# Patient Record
Sex: Female | Born: 1980 | Hispanic: Yes | Marital: Single | Smoking: Never smoker
Health system: Southern US, Community
[De-identification: ages and names within clinical notes are randomized; demographics above are authoritative.]

## PROBLEM LIST (undated history)

## (undated) DIAGNOSIS — I10 Essential (primary) hypertension: Secondary | ICD-10-CM

## (undated) HISTORY — PX: NO PAST SURGERIES: SHX2092

---

## 2013-08-20 ENCOUNTER — Emergency Department (HOSPITAL_COMMUNITY)
Admission: EM | Admit: 2013-08-20 | Discharge: 2013-08-20 | Disposition: A | Discharge status: Home / Self Care | Payer: Self-pay | Attending: Emergency Medicine | Admitting: Emergency Medicine

## 2013-08-20 ENCOUNTER — Emergency Department (HOSPITAL_COMMUNITY): Payer: Self-pay

## 2013-08-20 ENCOUNTER — Encounter (HOSPITAL_COMMUNITY): Payer: Self-pay | Admitting: Emergency Medicine

## 2013-08-20 DIAGNOSIS — O200 Threatened abortion: Secondary | ICD-10-CM | POA: Insufficient documentation

## 2013-08-20 DIAGNOSIS — O169 Unspecified maternal hypertension, unspecified trimester: Secondary | ICD-10-CM | POA: Insufficient documentation

## 2013-08-20 DIAGNOSIS — M545 Low back pain, unspecified: Secondary | ICD-10-CM | POA: Insufficient documentation

## 2013-08-20 DIAGNOSIS — O9989 Other specified diseases and conditions complicating pregnancy, childbirth and the puerperium: Secondary | ICD-10-CM

## 2013-08-20 DIAGNOSIS — M259 Joint disorder, unspecified: Secondary | ICD-10-CM | POA: Insufficient documentation

## 2013-08-20 DIAGNOSIS — M899 Disorder of bone, unspecified: Secondary | ICD-10-CM | POA: Insufficient documentation

## 2013-08-20 HISTORY — DX: Essential (primary) hypertension: I10

## 2013-08-20 LAB — CBC
HCT: 38.6 % (ref 36.0–46.0)
Hemoglobin: 12.4 g/dL (ref 12.0–15.0)
MCH: 25.1 pg — ABNORMAL LOW (ref 26.0–34.0)
MCHC: 32.1 g/dL (ref 30.0–36.0)
MCV: 78 fL (ref 78.0–100.0)
PLATELETS: 302 10*3/uL (ref 150–400)
RBC: 4.95 MIL/uL (ref 3.87–5.11)
RDW: 16.4 % — AB (ref 11.5–15.5)
WBC: 10.7 10*3/uL — AB (ref 4.0–10.5)

## 2013-08-20 LAB — HCG, QUANTITATIVE, PREGNANCY: hCG, Beta Chain, Quant, S: 14562 m[IU]/mL — ABNORMAL HIGH (ref <5)

## 2013-08-20 LAB — BASIC METABOLIC PANEL
BUN: 8 mg/dL (ref 6–23)
CHLORIDE: 100 meq/L (ref 96–112)
CO2: 20 mEq/L (ref 19–32)
Calcium: 9.1 mg/dL (ref 8.4–10.5)
Creatinine, Ser: 0.51 mg/dL (ref 0.50–1.10)
GFR calc non Af Amer: >90 mL/min (ref >90)
Glucose, Bld: 108 mg/dL — ABNORMAL HIGH (ref 70–99)
POTASSIUM: 3.7 meq/L (ref 3.7–5.3)
SODIUM: 134 meq/L — AB (ref 137–147)

## 2013-08-20 LAB — POC URINE PREG, ED: PREG TEST UR: POSITIVE — AB

## 2013-08-20 LAB — ABO/RH: ABO/RH(D): O POS

## 2013-08-20 NOTE — Discharge Instructions (Signed)
Aborto espontáneo  °(Miscarriage) °El aborto espontáneo es la pérdida de un bebé que no ha nacido (feto) antes de la semana 20 del embarazo. La mayor parte de estos abortos ocurre en los primeros 3 meses. En algunos casos ocurre antes de que la mujer sepa que está embarazada. También se denomina "aborto espontáneo" o "pérdida prematura del embarazo". El aborto espontáneo puede ser una experiencia que afecte emocionalmente a la persona. Converse con su médico si tiene dudas, cómo es el proceso de duelo, y sobre planes futuros de embarazo.  °CAUSAS  °· Algunos problemas cromosómicos pueden hacer imposible que el bebé se desarrolle normalmente. Los problemas con los genes o cromosomas del bebé son generalmente el resultado de errores que se producen, por casualidad, cuando el embrión se divide y crece. Estos problemas no se heredan de los padres. °· Infección en el cuello del útero.   °· Problemas hormonales.   °· Problemas en el cuello del útero, como tener un útero incompetente. Esto ocurre cuando los tejidos no son lo suficientemente fuertes como para contener el embarazo.   °· Problemas del útero, como un útero con forma anormal, los fibromas o anormalidades congénitas.   °· Ciertas enfermedades crónicas.   °· No fume, no beba alcohol, ni consuma drogas.   °· Traumatismos   °A veces, la causa es desconocida.  °SÍNTOMAS  °· Sangrado o manchado vaginal, con o sin cólicos o dolor. °· Dolor o cólicos en el abdomen o en la cintura. °· Eliminación de líquido, tejidos o coágulos grandes por la vagina. °DIAGNÓSTICO  °El médico le hará un examen físico. También le indicará una ecografía para confirmar el aborto. Es posible que se realicen análisis de sangre.  °TRATAMIENTO  °· En algunos casos el tratamiento no es necesario, si se eliminan naturalmente todos los tejidos embrionarios que se encontraban en el útero. Si el feto o la placenta quedan dentro del útero (aborto incompleto), pueden infectarse, los tejidos que quedan  pueden infectarse y deben retirarse. Generalmente se realiza un procedimiento de dilatación y curetaje (D y C). Durante el procedimiento de dilatación y curetaje, el cuello del útero se abre (dilata) y se retira cualquier resto de tejido fetal o placentario del útero. °· Si hay una infección, le recetarán antibióticos. Podrán recetarle otros medicamentos para reducir el tamaño del útero (contraerlo) si hay una mucho sangrado. °· Si su sangre es Rh negativa y su bebé es Rh positivo, usted necesitará la inyección de inmunoglobulina Rh. Esta inyección protegerá a los futuros bebés de tener problemas de compatibilidad Rh en futuros embarazos. °INSTRUCCIONES PARA EL CUIDADO EN EL HOGAR  °· El médico le indicará reposo en cama o le permitirá realizar actividades livianas. Vuelva a la actividad lentamente o según las indicaciones de su médico. °· Pídale a alguien que la ayude con las responsabilidades familiares y del hogar durante este tiempo.   °· Lleve un registro de la cantidad y la saturación de las toallas higiénicas que utiliza cada día. Anote esta información   °· No use tampones. No No se haga duchas vaginales ni tenga relaciones sexuales hasta que el médico la autorice.   °· Sólo tome medicamentos de venta libre o recetados para calmar el dolor o el malestar, según las indicaciones de su médico.   °· No tome aspirina. La aspirina puede ocasionar hemorragias.   °· Concurra puntualmente a las citas de control con el médico.   °· Si usted o su pareja tienen dificultades con el duelo, hable con su médico para buscar la ayuda psicológica que los ayude a enfrentar la pérdida   del embarazo. Permítase el tiempo suficiente de duelo antes de quedar embarazada nuevamente.   °SOLICITE ATENCIÓN MÉDICA DE INMEDIATO SI:  °· Siente calambres intensos o dolor en la espalda o en el abdomen. °· Tiene fiebre. °· Elimina grandes coágulos de sangre (del tamaño de una nuez o más) o tejidos por la vagina. Guarde lo que ha eliminado para  que su médico lo examine.   °· La hemorragia aumenta.   °· Observa una secreción vaginal espesa y con mal olor. °· Se siente mareada, débil, o se desmaya.   °· Siente escalofríos.   °ASEGÚRESE DE QUE:  °· Comprende estas instrucciones. °· Controlará su enfermedad. °· Solicitará ayuda de inmediato si no mejora o si empeora. °Document Released: 12/15/2004 Document Revised: 07/02/2012 °ExitCare® Patient Information ©2014 [employer]. ° °

## 2013-08-20 NOTE — ED Notes (Signed)
Per pt report: pt is 2.5 months pregnant and began having vaginal bleeding yesterday.  Pt reports changing her pad 3x in the past hour.  Pt a/o x 4.  Skin warm and dry. Pt ambulatory.

## 2013-08-20 NOTE — ED Provider Notes (Signed)
CSN: [id number]     Arrival date & time 08/20/13  0317 History   First MD Initiated Contact with Patient 08/20/13 [phone]     Chief Complaint  Patient presents with  . Vaginal Bleeding     HPI Based on her last normal menstrual period the patient believes she is approximately [redacted] weeks pregnant.  She's had a positive pregnancy test at home.  She has not had a first trimester ultrasound.  She has not seen a obstetric to this point.  Patient is a G2 P1 A0.  She states she develop vaginal bleeding yesterday as well as some crampy low back pain.  Her vaginal bleeding continued today.  Her bleeding worsened this evening which brought her to the emergency department.  She's not remember passing any tissue.  Denies abdominal pain.  Denies lightheadedness or weakness.  No prior history of 60 transmitted disease.  No prior history of ectopic pregnancy.   Past Medical History  Diagnosis Date  . Hypertension    History reviewed. No pertinent past surgical history. No family history on file. History  Substance Use Topics  . Smoking status: Never Smoker   . Smokeless tobacco: Not on file  . Alcohol Use: No   OB History   Grav Para Term Preterm Abortions TAB SAB Ect Mult Living   1              Review of Systems  All other systems reviewed and are negative.     Allergies  Review of patient's allergies indicates no known allergies.  Home Medications   Prior to Admission medications   Medication Sig Start Date End Date Taking? Authorizing Provider  Prenatal Vit-Fe Fumarate-FA (PRENATAL MULTIVITAMIN) TABS tablet Take 1 tablet by mouth daily at 12 noon.   Yes Historical Provider, MD   BP 123/82  Pulse 94  Temp(Src) 98 F (36.7 C) (Oral)  Resp 18  SpO2 100% Physical Exam  Nursing note and vitals reviewed. Constitutional: She is oriented to person, place, and time. She appears well-developed and well-nourished. No distress.  HENT:  Head: Normocephalic and atraumatic.  Eyes: EOM are  normal.  Neck: Normal range of motion.  Cardiovascular: Normal rate, regular rhythm and normal heart sounds.   Pulmonary/Chest: Effort normal and breath sounds normal.  Abdominal: Soft. She exhibits no distension. There is no tenderness.  Genitourinary:  Normal external genitalia.  Cervical os open.  No obvious fetal tissue noted.  No active vaginal bleeding.  Blood noted in the vaginal vault with some clotting  Musculoskeletal: Normal range of motion.  Neurological: She is alert and oriented to person, place, and time.  Skin: Skin is warm and dry.  Psychiatric: She has a normal mood and affect. Judgment normal.    ED Course  Procedures (including critical care time) Labs Review Labs Reviewed  CBC - Abnormal; Notable for the following:    WBC 10.7 (*)    MCH 25.1 (*)    RDW 16.4 (*)    All other components within normal limits  BASIC METABOLIC PANEL - Abnormal; Notable for the following:    Sodium 134 (*)    Glucose, Bld 108 (*)    All other components within normal limits  HCG, QUANTITATIVE, PREGNANCY - Abnormal; Notable for the following:    hCG, Beta Chain, Quant, S 14562 (*)    All other components within normal limits  POC URINE PREG, ED - Abnormal; Notable for the following:    Preg Test, Ur POSITIVE (*)  All other components within normal limits  ABO/RH    Imaging Review US Ob Comp Less 14 Wks  08/20/2013   CLINICAL DATA:  Pelvic pain in pregnancy.  EXAM: OBSTETRIC <14 WK [place] AND TRANSVAGINAL OB US  TECHNIQUE: Both transabdominal and transvaginal ultrasound examinations were performed for complete evaluation of the gestation as well as the maternal uterus, adnexal regions, and pelvic cul-de-sac. Transvaginal technique was performed to assess early pregnancy.  COMPARISON:  None.  FINDINGS: Intrauterine gestational sac: Visualized/normal in shape.  Yolk sac:  Not seen  Embryo:  Not seen  MSD:  18  mm  Maternal uterus/adnexae: The ovaries are symmetric in size and normal in  appearance. Unremarkable uterus. No free pelvic fluid. No subchorionic hematoma.  IMPRESSION: Intrauterine gestation, mean sac diameter 18 mm. No fetal contents yet visible; findings are suspicious but not yet definitive for failed pregnancy. Recommend follow-up US in 10-14 days for definitive diagnosis. This recommendation follows SRU consensus guidelines: Diagnostic Criteria for Nonviable Pregnancy Early in the First Trimester. Malva Limes Med 2013; 165:7903-83.   Electronically Signed   By: Tiburcio Pea M.D.   On: 08/20/2013 05:47   US Ob Transvaginal  08/20/2013   CLINICAL DATA:  Pelvic pain in pregnancy.  EXAM: OBSTETRIC <14 WK [place] AND TRANSVAGINAL OB US  TECHNIQUE: Both transabdominal and transvaginal ultrasound examinations were performed for complete evaluation of the gestation as well as the maternal uterus, adnexal regions, and pelvic cul-de-sac. Transvaginal technique was performed to assess early pregnancy.  COMPARISON:  None.  FINDINGS: Intrauterine gestational sac: Visualized/normal in shape.  Yolk sac:  Not seen  Embryo:  Not seen  MSD:  18  mm  Maternal uterus/adnexae: The ovaries are symmetric in size and normal in appearance. Unremarkable uterus. No free pelvic fluid. No subchorionic hematoma.  IMPRESSION: Intrauterine gestation, mean sac diameter 18 mm. No fetal contents yet visible; findings are suspicious but not yet definitive for failed pregnancy. Recommend follow-up US in 10-14 days for definitive diagnosis. This recommendation follows SRU consensus guidelines: Diagnostic Criteria for Nonviable Pregnancy Early in the First Trimester. Malva Limes Med 2013; 338:3291-91.   Electronically Signed   By: Tiburcio Pea M.D.   On: 08/20/2013 05:47  I personally reviewed the imaging tests through PACS system I reviewed available ER/hospitalization records through the EMR    EKG Interpretation None      MDM   Final diagnoses:  Threatened miscarriage in early pregnancy    I spoke with the  patient at length with the interpreter phone.  I suspect this is a complete miscarriage to this point.  I do not visualize any fetal tissue.  Patient will followup at [facility] hospital in 48 hours for a repeat beta-hCG.  Patient understands to return either to this emergency Department or to the [facility] emergency department for any new or worsening vaginal bleeding.  No free fluid.  No adnexal masses noted by radiology.  Suspect complete miscarriage.     Lyanne Co, MD 08/20/13 [phone]

## 2013-08-20 NOTE — ED Notes (Signed)
Dr. Campos at bedside   

## 2013-08-20 NOTE — ED Notes (Signed)
US at bedside

## 2013-08-20 NOTE — ED Notes (Signed)
Pt made aware of need for urine but reports she does not have to go right now.  Pt was also made aware that the MD wants the urine within 30 minutes and staff will in and out cath her to obtain urine.

## 2013-08-26 ENCOUNTER — Encounter (HOSPITAL_COMMUNITY): Payer: Self-pay

## 2013-08-26 ENCOUNTER — Inpatient Hospital Stay (HOSPITAL_COMMUNITY)
Admission: AD | Admit: 2013-08-26 | Discharge: 2013-08-26 | Disposition: A | Discharge status: Home / Self Care | Payer: Self-pay | Source: Ambulatory Visit | Attending: Obstetrics and Gynecology | Admitting: Obstetrics and Gynecology

## 2013-08-26 DIAGNOSIS — O039 Complete or unspecified spontaneous abortion without complication: Secondary | ICD-10-CM | POA: Insufficient documentation

## 2013-08-26 LAB — URINALYSIS, ROUTINE W REFLEX MICROSCOPIC
BILIRUBIN URINE: NEGATIVE
Glucose, UA: NEGATIVE mg/dL
Ketones, ur: NEGATIVE mg/dL
Nitrite: NEGATIVE
Protein, ur: NEGATIVE mg/dL
UROBILINOGEN UA: 0.2 mg/dL (ref 0.0–1.0)
pH: 5.5 (ref 5.0–8.0)

## 2013-08-26 LAB — URINE MICROSCOPIC-ADD ON

## 2013-08-26 LAB — HCG, QUANTITATIVE, PREGNANCY: hCG, Beta Chain, Quant, S: 324 m[IU]/mL — ABNORMAL HIGH (ref <5)

## 2013-08-26 NOTE — MAU Provider Note (Signed)
Attestation of Attending Supervision of Advanced Practitioner (CNM/NP): Evaluation and management procedures were performed by the Advanced Practitioner under my supervision and collaboration.  I have reviewed the Advanced Practitioner's note and chart, and I agree with the management and plan.  Cranston Koors 08/26/2013 2:05 PM   

## 2013-08-26 NOTE — Discharge Instructions (Signed)
Aborto incompleto °(Incomplete Miscarriage) °Un aborto espontáneo es la pérdida repentina de un bebé en gestación (feto) antes de la semana 20 del embarazo. En un aborto espontáneo, partes del feto o la placenta (alumbramiento) permanecen en el cuerpo.  °El aborto espontáneo puede ser una experiencia que afecte emocionalmente a la persona. Hable con su médico si tiene preguntas sobre el aborto espontáneo, el proceso de duelo y los planes futuros de embarazo. °CAUSAS  °· Algunos problemas cromosómicos pueden hacer imposible que el bebé se desarrolle normalmente. Los problemas con los genes o cromosomas del bebé son, en la mayoría de los casos, el resultado de errores que se producen, al azar, cuando el embrión se divide y crece. Estos problemas no se heredan de los padres. °· Infección en el cuello del útero. °· Problemas hormonales. °· Problemas en el cuello del útero, como tener un útero incompetente. Esto ocurre cuando los tejidos no son lo suficientemente fuertes como para contener el embarazo. °· Problemas del útero, como un útero con forma anormal, los fibromas o anormalidades congénitas. °· Ciertas enfermedades crónicas. °· No fume, no beba alcohol, ni consuma drogas. °· Traumatismos. °SÍNTOMAS  °· Sangrado o manchado vaginal, con o sin cólicos o dolor. °· Dolor o cólicos en el abdomen o en la cintura. °· Eliminación de líquido, tejidos o coágulos grandes por la vagina. °DIAGNÓSTICO  °El médico le hará un examen físico. También le indicará una ecografía para confirmar el aborto. Es posible que se realicen análisis de sangre. °TRATAMIENTO  °· Generalmente se realiza un procedimiento de dilatación y curetaje (D y C). Durante el procedimiento de dilatación y curetaje, el cuello del útero se abre (dilata) y se retira todo resto de tejido fetal o placentario del útero. °· Si hay una infección, le recetarán antibióticos. Posiblemente le receten otros medicamentos para reducir (contraer) el tamaño del útero si hay  mucha hemorragia. °· Si su tipo de sangre es Rh negativo y el del bebé es Rh positivo, necesitará una inyección de inmunoglobulina Rho(D). Esta inyección protegerá a los futuros bebés de tener problemas de compatibilidad Rh en futuros embarazos. °· Probablemente le indiquen reposo. Esto significa que debe quedarse en cama y levantarse únicamente para ir al baño. °INSTRUCCIONES PARA EL CUIDADO EN EL HOGAR  °· Haga reposo según las indicaciones del médico. °· Limite las actividades según las indicaciones del médico. Es posible que se le permita retomar las actividades livianas si no se le realizó un curetaje, pero necesitará tratamiento adicional. °· Lleve un registro de la cantidad de toallas sanitarias que usa por día. Observe cuán impregnadas (saturadas) están. Registre esta información. °· No  use tampones. °· No se haga duchas vaginales ni tenga relaciones sexuales hasta que el médico la autorice. °· Asista a todas las citas de seguimiento para una nueva evaluación y para continuar el tratamiento. °· Sólo tome medicamentos de venta libre o recetados para calmar el dolor, el malestar o bajar la fiebre, según las indicaciones de su médico. °· Tome los antibióticos como le indicó el médico. Asegúrese de que finaliza la prescripción completa aunque se sienta mejor. °SOLICITE ATENCIÓN MÉDICA DE INMEDIATO SI:  °· Siente calambres intensos en el estómago, en la espalda o en el abdomen. °· Le sube la fiebre sin motivo (asegúrese de registrar las cifras). °· Elimina coágulos grandes o tejidos (consérvelos para que el médico los analice). °· La hemorragia aumenta. °· Se siente mareada, débil o tiene episodios de desmayo. °ASEGÚRESE DE QUE:  °· Comprende estas instrucciones. °·   Controlará su afección. °· Recibirá ayuda de inmediato si no mejora o si empeora. °Document Released: 03/07/2005 Document Revised: 12/26/2012 °ExitCare® Patient Information ©2014 [employer]. ° °

## 2013-08-26 NOTE — MAU Note (Signed)
Urine in lab 

## 2013-08-26 NOTE — MAU Note (Signed)
Patient states she was seen at [facility] on 6-2 and was instructed to return to MAU yesterday. Has lower abdominal pain that comes and goes. States light bleeding. No blood on pad when patient arrived in MAU.

## 2013-08-26 NOTE — MAU Provider Note (Signed)
Ms. Olivianna Halas is a 33 y.o. G1P0 at [redacted]w[redacted]d who presents to MAU today for follow-up. The patient was seen at [facility] on 08/20/13 and US showed IUGS only. Patient has continued to have bleeding similar to a period that has been lighter today. She rates her abdominal pain at 6/10 now and states that it is relieved by Ibuprofen.   BP 134/92  Pulse 73  Temp(Src) 98.4 F (36.9 C) (Oral)  Resp 16  Ht 5' 2.5" (1.588 m)  Wt 184 lb 12.8 oz (83.825 kg)  BMI 33.24 kg/m2  LMP 06/13/2013 GENERAL: Well-developed, well-nourished female in no acute distress.  HEENT: Normocephalic, atraumatic.   LUNGS: Effort normal HEART: Regular rate  SKIN: Warm, dry and without erythema PSYCH: Normal mood and affect  Results for JALEEA, MALACHOWSKI (MRN [id number]) as of 08/26/2013 10:19  Ref. Range 08/20/2013 03:49 08/20/2013 04:25 08/20/2013 05:25 08/26/2013 08:20 08/26/2013 09:16  hCG, Beta Chain, Quant, S Latest Range: <5 mIU/mL 14562 (H)    324 (H)    MDM [facility], interpreter was present for patient encounter  A: SAB  P: Discharge home Bleeding precautions discussed Patient referred to [facility] for follow-up in 2 weeks. They will call the patient with an appointment Patient advised to use condoms if she decides to be sexually active prior to her clinic appointment Patient may return to MAU as needed or if her condition were to change or worsen  Freddi Starr, PA-C 08/26/2013 10:20 AM

## 2013-09-09 ENCOUNTER — Ambulatory Visit (INDEPENDENT_AMBULATORY_CARE_PROVIDER_SITE_OTHER): Payer: Self-pay | Admitting: Family Medicine

## 2013-09-09 ENCOUNTER — Encounter: Payer: Self-pay | Admitting: Family Medicine

## 2013-09-09 VITALS — BP 118/86 | HR 79 | Ht 62.0 in | Wt 185.3 lb

## 2013-09-09 DIAGNOSIS — O039 Complete or unspecified spontaneous abortion without complication: Secondary | ICD-10-CM

## 2013-09-09 NOTE — Patient Instructions (Signed)
Aborto espontáneo  °(Miscarriage) °El aborto espontáneo es la pérdida de un bebé que no ha nacido (feto) antes de la semana 20 del embarazo. La mayor parte de estos abortos ocurre en los primeros 3 meses. En algunos casos ocurre antes de que la mujer sepa que está embarazada. También se denomina "aborto espontáneo" o "pérdida prematura del embarazo". El aborto espontáneo puede ser una experiencia que afecte emocionalmente a la persona. Converse con su médico si tiene dudas, cómo es el proceso de duelo, y sobre planes futuros de embarazo.  °CAUSAS  °· Algunos problemas cromosómicos pueden hacer imposible que el bebé se desarrolle normalmente. Los problemas con los genes o cromosomas del bebé son generalmente el resultado de errores que se producen, por casualidad, cuando el embrión se divide y crece. Estos problemas no se heredan de los padres. °· Infección en el cuello del útero.   °· Problemas hormonales.   °· Problemas en el cuello del útero, como tener un útero incompetente. Esto ocurre cuando los tejidos no son lo suficientemente fuertes como para contener el embarazo.   °· Problemas del útero, como un útero con forma anormal, los fibromas o anormalidades congénitas.   °· Ciertas enfermedades crónicas.   °· No fume, no beba alcohol, ni consuma drogas.   °· Traumatismos   °A veces, la causa es desconocida.  °SÍNTOMAS  °· Sangrado o manchado vaginal, con o sin cólicos o dolor. °· Dolor o cólicos en el abdomen o en la cintura. °· Eliminación de líquido, tejidos o coágulos grandes por la vagina. °DIAGNÓSTICO  °El médico le hará un examen físico. También le indicará una ecografía para confirmar el aborto. Es posible que se realicen análisis de sangre.  °TRATAMIENTO  °· En algunos casos el tratamiento no es necesario, si se eliminan naturalmente todos los tejidos embrionarios que se encontraban en el útero. Si el feto o la placenta quedan dentro del útero (aborto incompleto), pueden infectarse, los tejidos que quedan  pueden infectarse y deben retirarse. Generalmente se realiza un procedimiento de dilatación y curetaje (D y C). Durante el procedimiento de dilatación y curetaje, el cuello del útero se abre (dilata) y se retira cualquier resto de tejido fetal o placentario del útero. °· Si hay una infección, le recetarán antibióticos. Podrán recetarle otros medicamentos para reducir el tamaño del útero (contraerlo) si hay una mucho sangrado. °· Si su sangre es Rh negativa y su bebé es Rh positivo, usted necesitará la inyección de inmunoglobulina Rh. Esta inyección protegerá a los futuros bebés de tener problemas de compatibilidad Rh en futuros embarazos. °INSTRUCCIONES PARA EL CUIDADO EN EL HOGAR  °· El médico le indicará reposo en cama o le permitirá realizar actividades livianas. Vuelva a la actividad lentamente o según las indicaciones de su médico. °· Pídale a alguien que la ayude con las responsabilidades familiares y del hogar durante este tiempo.   °· Lleve un registro de la cantidad y la saturación de las toallas higiénicas que utiliza cada día. Anote esta información   °· No use tampones. No No se haga duchas vaginales ni tenga relaciones sexuales hasta que el médico la autorice.   °· Sólo tome medicamentos de venta libre o recetados para calmar el dolor o el malestar, según las indicaciones de su médico.   °· No tome aspirina. La aspirina puede ocasionar hemorragias.   °· Concurra puntualmente a las citas de control con el médico.   °· Si usted o su pareja tienen dificultades con el duelo, hable con su médico para buscar la ayuda psicológica que los ayude a enfrentar la pérdida   del embarazo. Permítase el tiempo suficiente de duelo antes de quedar embarazada nuevamente.   °SOLICITE ATENCIÓN MÉDICA DE INMEDIATO SI:  °· Siente calambres intensos o dolor en la espalda o en el abdomen. °· Tiene fiebre. °· Elimina grandes coágulos de sangre (del tamaño de una nuez o más) o tejidos por la vagina. Guarde lo que ha eliminado para  que su médico lo examine.   °· La hemorragia aumenta.   °· Observa una secreción vaginal espesa y con mal olor. °· Se siente mareada, débil, o se desmaya.   °· Siente escalofríos.   °ASEGÚRESE DE QUE:  °· Comprende estas instrucciones. °· Controlará su enfermedad. °· Solicitará ayuda de inmediato si no mejora o si empeora. °Document Released: 12/15/2004 Document Revised: 07/02/2012 °ExitCare® Patient Information ©2015 [employer]. This information is not intended to replace advice given to you by your health care provider. Make sure you discuss any questions you have with your health care provider. ° °

## 2013-09-09 NOTE — Progress Notes (Signed)
S:  33 yo G2P1011 who presents today for f/u after SAB.   Seen on 6/2 in ED and had an US showing IUGS only Continued bleeding throughout the first part of JUne Seen in MAU on 6/8 and quant had dropped from 14562 to 324  Since then, bleeding has improved and is now gone No pain.   No fevers, chills, nausea, vomiting, diarrhea, constipation.     Past Medical History  Diagnosis Date  . Hypertension    History reviewed. No pertinent family history. History   Social History  . Marital Status: Single    Spouse Name: N/A    Number of Children: N/A  . Years of Education: N/A   Occupational History  . Not on file.   Social History Main Topics  . Smoking status: Never Smoker   . Smokeless tobacco: Not on file  . Alcohol Use: No  . Drug Use: No  . Sexual Activity: Not on file   Other Topics Concern  . Not on file   Social History Narrative  . No narrative on file    O: Filed Vitals:   09/09/13 1426  BP: 118/86  Pulse: 79  Height: 5\' 2"  (1.575 m)  Weight: 185 lb 4.8 oz (84.052 kg)   Gen: NAD, well appearing ABD: soft, ND, NT  A/P  - repeat quant today to ensure has gone down to normal  - asymptomatic  - advised to try and wait 3 months to start trying to conceive again.    BECK, Redmond BasemanKELI L, MD

## 2013-09-10 ENCOUNTER — Telehealth: Payer: Self-pay | Admitting: General Practice

## 2013-09-10 LAB — HCG, QUANTITATIVE, PREGNANCY: hCG, Beta Chain, Quant, S: 4.5 m[IU]/mL

## 2013-09-10 NOTE — Telephone Encounter (Signed)
Message copied by Kathee DeltonHILLMAN, CARRIE L on Tue Sep 10, 2013 11:31 AM ------      Message from: Vale HavenBECK, KELI L      Created: Tue Sep 10, 2013  9:37 AM       Can we let her know her Sharene Buttersquant was good? ------

## 2013-09-10 NOTE — Telephone Encounter (Signed)
Patient [facility] in to front [facility] and patient informed of results. Patient verbalized understanding and had no questions

## 2013-09-10 NOTE — Telephone Encounter (Signed)
Called patient with pacific interpreter [phone], no answer- left message that we are trying to reach you with results, nothing urgent but call us back at the clinics

## 2014-01-20 ENCOUNTER — Encounter: Payer: Self-pay | Admitting: Family Medicine

## 2015-03-26 ENCOUNTER — Ambulatory Visit (INDEPENDENT_AMBULATORY_CARE_PROVIDER_SITE_OTHER): Payer: Self-pay | Admitting: Obstetrics and Gynecology

## 2015-03-26 ENCOUNTER — Encounter: Payer: Self-pay | Admitting: Obstetrics and Gynecology

## 2015-03-26 VITALS — BP 125/84 | HR 93 | Wt 199.0 lb

## 2015-03-26 DIAGNOSIS — O99210 Obesity complicating pregnancy, unspecified trimester: Secondary | ICD-10-CM | POA: Insufficient documentation

## 2015-03-26 DIAGNOSIS — Z8759 Personal history of other complications of pregnancy, childbirth and the puerperium: Secondary | ICD-10-CM | POA: Insufficient documentation

## 2015-03-26 DIAGNOSIS — F329 Major depressive disorder, single episode, unspecified: Secondary | ICD-10-CM

## 2015-03-26 DIAGNOSIS — O09521 Supervision of elderly multigravida, first trimester: Secondary | ICD-10-CM

## 2015-03-26 DIAGNOSIS — O09529 Supervision of elderly multigravida, unspecified trimester: Secondary | ICD-10-CM | POA: Insufficient documentation

## 2015-03-26 DIAGNOSIS — F32A Depression, unspecified: Secondary | ICD-10-CM

## 2015-03-26 DIAGNOSIS — O99340 Other mental disorders complicating pregnancy, unspecified trimester: Secondary | ICD-10-CM

## 2015-03-26 DIAGNOSIS — Z124 Encounter for screening for malignant neoplasm of cervix: Secondary | ICD-10-CM

## 2015-03-26 DIAGNOSIS — O09899 Supervision of other high risk pregnancies, unspecified trimester: Secondary | ICD-10-CM | POA: Insufficient documentation

## 2015-03-26 DIAGNOSIS — Z1151 Encounter for screening for human papillomavirus (HPV): Secondary | ICD-10-CM

## 2015-03-26 DIAGNOSIS — O99211 Obesity complicating pregnancy, first trimester: Secondary | ICD-10-CM

## 2015-03-26 DIAGNOSIS — E669 Obesity, unspecified: Secondary | ICD-10-CM

## 2015-03-26 DIAGNOSIS — O09891 Supervision of other high risk pregnancies, first trimester: Secondary | ICD-10-CM

## 2015-03-26 DIAGNOSIS — Z113 Encounter for screening for infections with a predominantly sexual mode of transmission: Secondary | ICD-10-CM

## 2015-03-26 MED ORDER — ASPIRIN 81 MG PO TABS: 81.0000 mg | ORAL_TABLET | Freq: Every day | ORAL | Status: DC

## 2015-03-26 NOTE — Patient Instructions (Signed)
Primer trimestre de embarazo (First Trimester of Pregnancy) El primer trimestre de embarazo se extiende desde la semana1 hasta el final de la semana12 (mes1 al mes3). Una semana despus de que un espermatozoide fecunda un vulo, este se implantar en la pared uterina. Este embrin comenzar a desarrollarse hasta convertirse en un beb. Sus genes y los de su pareja forman el beb. Los genes del varn determinan si ser un nio o una nia. Entre la semana6 y la8, se forman los ojos y el rostro, y los latidos del corazn pueden verse en la ecografa. Al final de las 12semanas, todos los rganos del beb estn formados.  Ahora que est embarazada, querr hacer todo lo que est a su alcance para tener un beb sano. Dos de las cosas ms importantes son tener una buena atencin prenatal y seguir las indicaciones del mdico. La atencin prenatal incluye toda la asistencia mdica que usted recibe antes del nacimiento del beb. Esta ayudar a prevenir, detectar y tratar cualquier problema durante el embarazo y el parto. CAMBIOS EN EL ORGANISMO Su organismo atraviesa por muchos cambios durante el embarazo, y estos varan de una mujer a otra.   Al principio, puede aumentar o bajar algunos kilos.  Puede tener malestar estomacal (nuseas) y vomitar. Si no puede controlar los vmitos, llame al mdico.  Puede cansarse con facilidad.  Es posible que tenga dolores de cabeza que pueden aliviarse con los medicamentos que el mdico le permita tomar.  Puede orinar con mayor frecuencia. El dolor al orinar puede significar que usted tiene una infeccin de la vejiga.  Debido al embarazo, puede tener acidez estomacal.  Puede estar estreida, ya que ciertas hormonas enlentecen los movimientos de los msculos que empujan los desechos a travs de los intestinos.  Pueden aparecer hemorroides o abultarse e hincharse las venas (venas varicosas).  Las mamas pueden empezar a agrandarse y estar sensibles. Los pezones  pueden sobresalir ms, y el tejido que los rodea (areola) tornarse ms oscuro.  Las encas pueden sangrar y estar sensibles al cepillado y al hilo dental.  Pueden aparecer zonas oscuras o manchas (cloasma, mscara del embarazo) en el rostro que probablemente se atenuarn despus del nacimiento del beb.  Los perodos menstruales se interrumpirn.  Tal vez no tenga apetito.  Puede sentir un fuerte deseo de consumir ciertos alimentos.  Puede tener cambios a nivel emocional da a da, por ejemplo, por momentos puede estar emocionada por el embarazo y por otros preocuparse porque algo pueda salir mal con el embarazo o el beb.  Tendr sueos ms vvidos y extraos.  Tal vez haya cambios en el cabello que pueden incluir su engrosamiento, crecimiento rpido y cambios en la textura. A algunas mujeres tambin se les cae el cabello durante o despus del embarazo, o tienen el cabello seco o fino. Lo ms probable es que el cabello se le normalice despus del nacimiento del beb. QU DEBE ESPERAR EN LAS CONSULTAS PRENATALES Durante una visita prenatal de rutina:  La pesarn para asegurarse de que usted y el beb estn creciendo normalmente.  Le controlarn la presin arterial.  Le medirn el abdomen para controlar el desarrollo del beb.  Se escucharn los latidos cardacos a partir de la semana10 o la12 de embarazo, aproximadamente.  Se analizarn los resultados de los estudios solicitados en visitas anteriores. El mdico puede preguntarle:  Cmo se siente.  Si siente los movimientos del beb.  Si ha tenido sntomas anormales, como prdida de lquido, sangrado, dolores de cabeza intensos o   médico puede preguntarle:  · Cómo se siente.  · Si siente los movimientos del bebé.  · Si ha tenido síntomas anormales, como pérdida de líquido, sangrado, dolores de cabeza intensos o cólicos abdominales.  · Si está consumiendo algún producto que contenga tabaco, como cigarrillos, tabaco de mascar y cigarrillos electrónicos.  · Si tiene alguna pregunta.  Otros estudios que pueden realizarse durante el primer trimestre incluyen lo siguiente:  · Análisis de sangre para determinar el tipo  de sangre y detectar la presencia de infecciones previas. Además, se los usará para controlar si los niveles de hierro son bajos (anemia) y determinar los anticuerpos Rh. En una etapa más avanzada del embarazo, se harán análisis de sangre para saber si tiene diabetes, junto con otros estudios si surgen problemas.  · Análisis de orina para detectar infecciones, diabetes o proteínas en la orina.  · Una ecografía para confirmar que el bebé crece y se desarrolla correctamente.  · Una amniocentesis para diagnosticar posibles problemas genéticos.  · Estudios del feto para descartar espina bífida y síndrome de Down.  · Es posible que necesite otras pruebas adicionales.  · Prueba del VIH (virus de inmunodeficiencia humana). Los exámenes prenatales de rutina incluyen la prueba de detección del VIH, a menos que decida no realizársela.  INSTRUCCIONES PARA EL CUIDADO EN EL HOGAR   Medicamentos:  · Siga las indicaciones del médico en relación con el uso de medicamentos. Durante el embarazo, hay medicamentos que pueden tomarse y otros que no.  · Tome las vitaminas prenatales como se le indicó.  · Si está estreñida, tome un laxante suave, si el médico lo autoriza.  Dieta  · Consuma alimentos balanceados. Elija alimentos variados, como carne o proteínas de origen vegetal, pescado, leche y productos lácteos descremados, verduras, frutas y panes y cereales integrales. El médico la ayudará a determinar la cantidad de peso que puede aumentar.  · No coma carne cruda ni quesos sin cocinar. Estos elementos contienen bacterias que pueden causar defectos congénitos en el bebé.  · La ingesta diaria de cuatro o cinco comidas pequeñas en lugar de tres comidas abundantes puede ayudar a aliviar las náuseas y los vómitos. Si empieza a tener náuseas, comer algunas galletas saladas puede ser de ayuda. Beber líquidos entre las comidas en lugar de tomarlos durante las comidas también puede ayudar a calmar las náuseas y los vómitos.  · Si está  estreñida, consuma alimentos con alto contenido de fibra, como verduras y frutas frescas, y cereales integrales. Beba suficiente líquido para mantener la orina clara o de color amarillo pálido.  Actividad y ejercicios  · Haga ejercicio solamente como se lo haya indicado el médico. El ejercicio la ayudará a:    Controlar el peso.    Mantenerse en forma.    Estar preparada para el trabajo de parto y el parto.  · Los dolores, los cólicos en la parte baja del abdomen o los calambres en la cintura son un buen indicio de que debe dejar de hacer ejercicios. Consulte al médico antes de seguir haciendo ejercicios normales.  · Intente no estar de pie durante mucho tiempo. Mueva las piernas con frecuencia si debe estar de pie en un lugar durante mucho tiempo.  · Evite levantar pesos excesivos.  · Use zapatos de tacones bajos y mantenga una buena postura.  · Puede seguir teniendo relaciones sexuales, excepto que el médico le indique lo contrario.  Alivio del dolor o las molestias  · Use un sostén que le brinde buen   3 o 4veces por da. Limite la cantidad de sal en su dieta. Cuidados prenatales  Programe las visitas prenatales para la semana12 de [place]. Generalmente se programan cada mes al principio y se hacen ms frecuentes en los 2 ltimos meses antes del parto.  Escriba sus preguntas. Llvelas cuando concurra a las visitas prenatales.  Concurra a todas las visitas prenatales como se lo haya indicado el mdico. Seguridad  Colquese el cinturn de seguridad cuando conduzca.  Haga una lista de los nmeros de telfono de  Associate Professor, que [employer] nmeros de telfono de familiares, [place], el hospital y los departamentos de polica y bomberos. Consejos generales  Pdale al mdico que la derive a clases de educacin prenatal en su localidad. Debe comenzar a tomar las clases antes de Cytogeneticist en el mes6 de embarazo.  Pida ayuda si tiene necesidades nutricionales o de asesoramiento Academic librarian. El mdico puede aconsejarla o derivarla a especialistas para que la ayuden con diferentes necesidades.  No se d baos de inmersin en agua caliente, baos turcos ni saunas.  No se haga duchas vaginales ni use tampones o toallas higinicas perfumadas.  No mantenga las piernas cruzadas durante [place].  Evite el contacto con las bandejas sanitarias de los gatos y la tierra que estos animales usan. Estos elementos contienen bacterias que pueden causar defectos congnitos al beb y la posible prdida del feto debido a un aborto espontneo o muerte fetal.  No fume, no consuma hierbas ni medicamentos que no hayan sido recetados por el mdico. Las sustancias qumicas que estos productos contienen afectan la formacin y el desarrollo del beb.  No consuma ningn producto que contenga tabaco, lo que incluye cigarrillos, tabaco de Theatre manager y Administrator, Civil Service. Si necesita ayuda para dejar de fumar, consulte al [employer]. Puede recibir asesoramiento y otro tipo de recursos para dejar de fumar.  Programe una cita con el dentista. En su casa, lvese los dientes con un cepillo dental blando y psese el hilo dental con suavidad. SOLICITE ATENCIN MDICA SI:   Tiene mareos.  Siente clicos leves, presin en la pelvis o dolor persistente en el abdomen.  Tiene nuseas, vmitos o diarrea persistentes.  Tiene secrecin vaginal con mal olor.  Siente dolor al [employer].  Tiene el rostro, las [place], las piernas o los tobillos ms hinchados. SOLICITE ATENCIN MDICA DE INMEDIATO SI:   Tiene fiebre.  Tiene una prdida de  lquido por la vagina.  Tiene sangrado o pequeas prdidas vaginales.  Siente dolor intenso o clicos en el abdomen.  Sube o baja de peso rpidamente.  Vomita sangre de color rojo brillante o material que parezca granos de caf.  Ha estado expuesta a la rubola y no ha sufrido la enfermedad.  Ha estado expuesta a la quinta enfermedad o a la varicela.  Tiene un dolor de cabeza intenso.  Le falta el aire.  Sufre cualquier tipo de traumatismo, por ejemplo, debido a una cada o un accidente automovilstico.   Esta informacin no tiene Theme park manager el consejo del mdico. Asegrese de hacerle al mdico cualquier pregunta que tenga.   Document Released: 12/15/2004 Document Revised: 03/28/2014 Elsevier Interactive Patient Education 2016 [employer].  [employer] del mtodo anticonceptivo (Contraception Choices) La anticoncepcin (control de la natalidad) es el uso de cualquier mtodo o dispositivo para Location manager. A continuacin se indican algunos de esos mtodos. MTODOS HORMONALES   El Implante contraconceptivo consiste en un tubo plstico delgado que contiene la hormona progesterona. No contiene estrgenos. El mdico  inserta el tubo en la parte interna del brazo. El tubo puede Geneticist, molecular durante 3 aos. Despus de los 3 aos debe retirarse. El implante impide que los ovarios liberen vulos (ovulacin), espesa el moco cervical, lo que evita que los espermatozoides ingresen al tero y hace ms delgada la membrana que cubre el interior del tero.  Inyecciones de progesterona sola: las Insurance underwriter cada 3 meses para Location manager. La progesterona sinttica impide que los ovarios liberen vulos. Tambin hacen que el moco cervical se espese y modifique el tejido de recubrimiento interno del tero. Esto hace ms difcil que los espermatozoides sobrevivan en el tero.  Las pldoras anticonceptivas contienen estrgenos y Education officer, museum. Su funcin es [employer] ovarios liberen vulos (ovulacin). Las hormonas de los anticonceptivos orales hacen que el moco cervical se haga ms espeso, lo que evita que el esperma ingrese al tero. Las pldoras anticonceptivas son recetadas por el mdico.Tambin se utilizan para tratar los perodos menstruales abundantes.  Minipldora: este tipo de pldora anticonceptiva contiene slo hormona progesterona. Deben tomarse todos los [address] del mes y debe recetarlas el mdico.  El parche de control de natalidad: contiene hormonas similares a las que contienen las pldoras anticonceptivas. Deben cambiarse una vez por semana y se utilizan bajo prescripcin mdica.  Anillo vaginal: contiene hormonas similares a las que contienen las pldoras anticonceptivas. Se deja colocado durante tres semanas, se lo retira durante 1 semana y luego se coloca uno nuevo. La paciente debe sentirse cmoda al insertar y retirar el anillo de la vagina.Es necesaria la prescripcin mdica.  Anticonceptivos de emergencia: son mtodos para evitar un embarazo despus de Neomia Dear relacin sexual sin proteccin. Esta pldora puede tomarse inmediatamente despus de Child psychotherapist sexuales o hasta 5 [place] de haber tenido sexo sin proteccin. Es ms efectiva si se toma poco tiempo despus de la relacin sexual. Los anticonceptivos de emergencia estn disponibles sin prescripcin mdica. Consltelo con su farmacutico. No use los anticonceptivos de emergencia como nico mtodo anticonceptivo. MTODOS DE Lenis Noon   Condn masculino: es una vaina delgada (ltex o goma) que se coloca cubriendo al pene durante el acto sexual. Deri Fuelling con espermicida para aumentar la efectividad.  Condn femenino. Es una funda delicada y blanda que se adapta holgadamente a la vagina antes de las Clinical research associate.  Diafragma: es una barrera de ltex redonda y suave que debe ser recomendado por un profesional. Se inserta en la vagina, junto con un gel espermicida. Debe  insertarse antes de Management consultant. Debe dejar el diafragma colocado en la vagina durante 6 a 8 horas despus de la relacin sexual.  Capuchn cervical: es una barrera de ltex o taza plstica redonda y [place] que cubre el cuello del tero y debe ser colocada por un mdico. Puede dejarlo colocado en la vagina hasta 48 horas despus de las Clinical research associate.  Esponja: es una pieza blanda y circular de espuma de poliuretano. Contiene un espermicida. Se inserta en la vagina despus de mojarla y antes de las [employer].  Espermicidas: son sustancias qumicas que matan o bloquean al esperma y no lo dejan ingresar al cuello del tero y al tero. Vienen en forma de cremas, geles, supositorios, espuma o comprimidos. No es necesario tener Emergency planning/management officer. Se insertan en la vagina con un aplicador antes de Management consultant. El proceso debe repetirse cada vez que tiene relaciones sexuales. ANTICONCEPTIVOS INTRAUTERINOS  Dispositivo intrauterino (DIU) es un dispositivo en forma de T que se coloca  en el tero durante el perodo menstrual, para Location manager. Hay dos tipos:  DIU de cobre: este tipo de DIU est recubierto con un alambre de cobre y se inserta dentro del tero. El cobre hace que el tero y las trompas de Falopio produzcan un liquido que [employer] espermatozoides. Puede permanecer colocado durante 10 aos.  DIU con hormona: este tipo de DIU contiene la hormona progestina (progesterona sinttica). La hormona espesa el moco cervical y evita que los espermatozoides ingresen al tero y tambin afina la membrana que cubre el tero para evitar la implantacin del vulo fertilizado. La hormona debilita o destruye los espermatozoides que ingresan al tero. Puede Geneticist, molecular durante 3-5 aos, segn el tipo de DIU que se [place]. MTODOS ANTICONCEPTIVOS PERMANENTES  Ligadura de trompas en la mujer: se realiza sellando, atando u obstruyendo quirrgicamente las  trompas de Falopio lo que impide que el vulo descienda hacia el tero.  Esterilizacin histeroscpica: Implica la colocacin de un pequeo espiral o la insercin en cada trompa de Falopio. El mdico utiliza una tcnica llamada histeroscopa para Primary school teacher procedimiento. El dispositivo produce la formacin de tejido Designer, television/film set. Esto da como resultado una obstruccin permanente de las trompas de Falopio, de modo que la esperma no pueda fertilizar el vulo. Demora alrededor de 3 meses despus del procedimiento hasta que el conducto se obstruye. Tendr que usar otro mtodo anticonceptivo durante al menos 3 meses.  Esterilizacin masculina: se realiza ligando los conductos por los que pasan los espermatozoides (vasectoma).Esto impide que el esperma ingrese a la vagina durante el acto sexual. Luego del procedimiento, el hombre puede eyacular lquido (semen). MTODOS DE PLANIFICACIN NATURAL  Planificacin familiar natural: consiste en no Management consultant o usar un mtodo de barrera (condn, [place], capuchn cervical) en los [employer] la mujer podra quedar [place].  Mtodo de calendario: consiste en el seguimiento de la duracin de cada ciclo menstrual y la identificacin de los perodos frtiles.  Mtodo de ovulacin: Paramedic las relaciones sexuales durante la ovulacin.  Mtodo sintotrmico: Advertising copywriter sexuales en la poca en la que se est ovulando, utilizando un termmetro y tendiendo en cuenta los sntomas de la ovulacin.  Mtodo postovulacin: Youth worker las relaciones sexuales para despus de haber ovulado. Independientemente del tipo o mtodo anticonceptivo que usted elija, es importante que use condones para protegerse contra las infecciones de transmisin sexual (ETS). Hable con su mdico con respecto a qu mtodo anticonceptivo es el ms apropiado para usted.   Esta informacin no tiene Theme park manager el consejo del  mdico. Asegrese de hacerle al mdico cualquier pregunta que tenga.   Document Released: 03/07/2005 Document Revised: 11/07/2012 Elsevier Interactive Patient Education 2016 [employer].  Depresin posparto y baby blues (Postpartum Depression and Baby Blues) El perodo del posparto comienza inmediatamente despus del nacimiento de un beb y suele ser una poca de gran felicidad y mucho entusiasmo. Tambin es tiempo de muchos cambios en la vida de [place]. Sin importar cuntos hijos tenga una madre, cada nio plantea nuevos desafos y Neomia Dear nueva dinmica a la familia. Es frecuente que haya sentimientos de entusiasmo junto con cambios confusos en el estado de nimo, las emociones y los pensamientos. Todas las madres corren riesgo de tener depresin posparto o "baby blues". Estos cambios en el estado de nimo pueden presentarse inmediatamente despus del parto o muchos meses despus del nacimiento de un nio. El baby blues o la depresin posparto pueden ser  leves o graves. Adems, la depresin posparto puede desaparecer con bastante rapidez o puede ser una enfermedad de larga evolucin.  CAUSAS Se cree que el aumento de las concentraciones hormonales y su rpida disminucin es la causa principal de la depresin posparto y el baby blues. Durante y despus del [place], un nmero de hormonas [place]. El estrgeno y la progesterona generalmente disminuyen inmediatamente despus del parto. Tambin disminuyen con [employer] de la hormona tiroidea y de varios esteroides del cortisol. Otros factores que juegan un papel en estos cambios anmicos incluyen los sucesos importantes de la vida y Designer, industrial/product.  FACTORES DE RIESGO Si tiene alguno de los siguientes riesgos de desarrollar baby blues o depresin posparto, sepa a qu sntomas debe estar atenta durante el perodo del posparto. Los factores de riesgo que pueden aumentar la probabilidad de Warehouse manager baby blues o depresin posparto incluyen lo  siguiente:  Antecedentes personales o familiares de depresin.  Depresin Academic librarian.  Problemas anmicos premenstruales o que guardan relacin con los anticonceptivos orales.  Mucho estrs en la vida.  Conflictos maritales.  Falta de una red de apoyo social.  [place] Finlay un beb con necesidades especiales.  Problemas de salud, como diabetes. SIGNOS Y SNTOMAS Los sntomas de baby blues incluyen lo siguiente:  Cambios en el estado de nimo durante lapsos breves, como pasar de la felicidad extrema a la tristeza.  Falta de concentracin.  Dificultad para dormir.  Ataques de llanto, sensibilidad emocional.  Irritabilidad.  Ansiedad. Generalmente, los sntomas de depresin posparto comienzan en el trmino del primer mes despus del parto. Estos sntomas incluyen:  Dificultad para dormir o somnolencia excesiva.  Prdida de peso notable.  Agitacin.  Sentimientos de inutilidad.  Falta de inters en las actividades o la comida. La psicosis posparto es una enfermedad muy grave que puede ser peligrosa. Afortunadamente, es poco frecuente. Si aparece alguno de los siguientes sntomas, se debe recibir atencin mdica de inmediato. Los sntomas de psicosis posparto incluyen lo siguiente:   Alucinaciones y delirios.  Comportamiento atpico o desorganizado.  Confusin o desorientacin. DIAGNSTICO  El diagnstico se realiza mediante la evaluacin de los sntomas. No hay exmenes mdicos ni pruebas de laboratorio que permitan hacer un diagnstico, pero hay varios cuestionarios que el mdico puede usar para identificar a las personas que tienen baby blues, depresin posparto o psicosis. A menudo se [place] una herramienta de deteccin sistemtica llamada [place] de depresin posnatal de [place] para diagnosticar la depresin durante el perodo del posparto.  TRATAMIENTO Generalmente, el baby blues desaparece solo en el trmino de 1 o 2semanas. A menudo, todo lo que se necesita es  apoyo social. Se le recomendar que duerma y descanse lo suficiente. En ocasiones, se le pueden administrar medicamentos para ayudarla a dormir.  La depresin posparto requiere tratamiento porque puede durar varios meses o ms tiempo si no se la trata. El tratamiento puede incluir terapia individual o grupal, medicamentos o ambos para abordar los factores [place], fisiolgicos y psicolgicos que pueden tener influencia en la depresin. Tambin pueden recomendarse enfticamente el ejercicio regular, una alimentacin sana, el descanso y el apoyo social.  La psicosis posparto es una enfermedad ms grave y requiere tratamiento inmediato. A menudo es necesaria la hospitalizacin. INSTRUCCIONES PARA EL CUIDADO EN EL HOGAR  Descanse todo lo posible. Tome una siesta cuando el beb duerme.  Haga ejercicios regularmente. Para algunas mujeres, el yoga y las caminatas son beneficiosas.  Consuma una dieta equilibrada y nutritiva.  Haga pequeas cosas que disfruta. [employer]  una taza de t, dese un bao de burbujas, lea su revista favorita o escuche su msica predilecta.  Evite el alcohol.  Pida ayuda con los [employer], la cocina, las compras de comida o las obligaciones diarias si lo necesita. No intente hacer todo.  Hable con personas allegadas sobre cmo se siente. Busque el apoyo de su pareja, sus familiares, sus amigos o de [employer] primerizas.  Intente pensar positivamente. Piense en aquellas cosas por las que se siente agradecida.  No pase mucho tiempo sola.  Tome solo medicamentos de venta libre o recetados, segn las indicaciones del mdico.  Cumpla con todos los controles del postparto.  Infrmele a su mdico sobre cualquier inquietud que tenga. SOLICITE ATENCIN MDICA SI: [place] reaccin al medicamento o problemas con este. SOLICITE ATENCIN MDICA DE INMEDIATO SI:  Tiene sentimientos suicidas.  Cree que podra lastimar al beb o a Engineer, maintenance (IT). ASEGRESE DE  QUE:  Comprende estas instrucciones.  Controlar su afeccin.  Recibir ayuda de inmediato si no mejora o si empeora.   Esta informacin no tiene Theme park manager el consejo del mdico. Asegrese de hacerle al mdico cualquier pregunta que tenga.   Document Released: 08/24/2007 Document Revised: 03/12/2013 Elsevier Interactive Patient Education 2016 [employer].  Gregory materna (Breastfeeding) Decid[place] Museum/gallery exhibitions officer es una de las mejores elecciones que puede hacer por usted y su beb. El cambio hormonal durante el Psychiatrist produce el desarrollo del tejido mamario y [place] la cantidad y el tamao de los conductos galactforos. Estas hormonas tambin permiten que las protenas, los azcares y las grasas de la sangre produzcan la [employer] materna en las glndulas productoras de [place]. Las hormonas impiden que la leche materna sea liberada antes del nacimiento del beb, adems de impulsar el flujo de leche luego del nacimiento. Una vez que ha comenzado a Museum/gallery exhibitions officer, Conservation officer, nature beb, as Immunologist succin o Theatre manager, pueden estimular la liberacin de [place] de las glndulas productoras de [place].  LOS BENEFICIOS DE AMAMANTAR Para el beb  La primera leche (calostro) ayuda a Careers information officer funcionamiento del sistema digestivo del beb.  La leche tiene anticuerpos que ayudan a Radio producer las infecciones en el beb.  El beb tiene una menor incidencia de asma, alergias y del sndrome de muerte sbita del lactante.  Los nutrientes en la [place] materna son mejores para el beb que la [place] maternizada y estn preparados exclusivamente para cubrir las necesidades del beb.  La leche materna mejora el desarrollo cerebral del beb.  Es menos probable que el beb desarrolle otras enfermedades, como obesidad infantil, asma o diabetes mellitus de tipo 2. Para usted   La lactancia materna favorece el desarrollo de un vnculo muy especial entre la madre y el beb.  Es conveniente. La leche materna siempre  est disponible a la Human resources officer y es [place].  La lactancia materna ayuda a quemar caloras y a perder el peso ganado durante el [place].  Favorece la contraccin del tero al tamao que tena antes del embarazo de manera ms rpida y disminuye el sangrado (loquios) despus del parto.  La lactancia materna contribuye a reducir Nurse, adult de desarrollar diabetes mellitus de tipo 2, osteoporosis o cncer de mama o de ovario en el futuro. SIGNOS DE QUE EL BEB EST HAMBRIENTO Primeros signos de [address] de [place].  Se estira.  Mueve la cabeza de un lado a otro.  Mueve la cabeza y abre la boca cuando se le toca la  mejilla o la comisura de la boca (reflejo de bsqueda).  Aumenta las vocalizaciones, tales como sonidos de succin, se relame los labios, emite arrullos, suspiros, o chirridos.  Mueve la [employer] boca.  Se chupa con ganas los dedos o las manos. Signos tardos de [employer].  Llora de manera intermitente. Signos de [employer] signos de hambre extrema requerirn que lo calme y lo consuele antes de que el beb pueda alimentarse adecuadamente. No espere a que se manifiesten los siguientes signos de hambre extrema para comenzar a Museum/gallery exhibitions officer:   Designer, jewellery.  Llanto intenso y fuerte.  Gritos. INFORMACIN BSICA SOBRE LA LACTANCIA MATERNA Iniciacin de la lactancia materna  Encuentre un lugar cmodo para sentarse o acostarse, con un buen respaldo para el cuello y la espalda.  Coloque una almohada o una manta enrollada debajo del beb para acomodarlo a la altura de la mama (si est sentada). Las almohadas para Museum/gallery exhibitions officer se han diseado especialmente a fin de servir de apoyo para los brazos y el beb [employer].  Asegrese de que el abdomen del beb est frente al suyo.   Masajee suavemente la mama. Con las yemas de los dedos, masajee la pared del pecho hacia el pezn en un movimiento circular. Esto estimula  el flujo de [place]. Es posible que Engineer, manufacturing systems este movimiento mientras amamanta si la leche fluye lentamente.  Sostenga la mama con el pulgar por arriba del pezn y los otros 4 dedos por debajo de la mama. Asegrese de que los dedos se encuentren lejos del pezn y de la boca del beb.  Empuje suavemente los labios del beb con el pezn o con el dedo.  Cuando la boca del beb se abra lo suficiente, acrquelo rpidamente a la mama e introduzca todo el pezn y la zona oscura que lo rodea (areola), tanto como sea posible, dentro de la boca del beb.  Debe haber ms areola visible por arriba del labio superior del beb que por debajo del labio inferior.  La lengua del beb debe estar entre la enca inferior y la [place].  Asegrese de que la boca del beb est en la posicin correcta alrededor del pezn (prendida). Los labios del beb deben crear un sello sobre la mama y estar doblados hacia afuera (invertidos).  Es comn que el beb succione durante 2 a 3 minutos para que comience el flujo de [place]. Cmo debe prenderse Es muy importante que le ensee al beb cmo prenderse adecuadamente a la mama. Si el beb no se prende adecuadamente, puede causarle dolor en el pezn y reducir la produccin de [place], y hacer que el beb tenga un escaso aumento de [place]. Adems, si el beb no se prende adecuadamente al pezn, puede tragar aire durante la alimentacin. Esto puede causarle molestias al beb. Hacer eructar al beb al [place] de mama puede ayudarlo a liberar el aire. Sin embargo, ensearle al beb cmo prenderse a la mama adecuadamente es la mejor manera de evitar que se sienta molesto por tragar Oceanographer se alimenta. Signos de que el beb se ha prendido adecuadamente al pezn:   Payton Doughty o succiona de modo silencioso, sin causarle dolor.  Se escucha que traga cada 3 o 4 succiones.  Hay movimientos musculares por arriba y por delante de sus odos al Printmaker. Signos de que el beb  no se ha prendido Audiological scientist al pezn:   Hace ruidos de succin o de chasquido mientras se alimenta.  Siente dolor en  el pezn. Si cree que el beb no se prendi correctamente, deslice el dedo en la comisura de la boca y [employer] las encas del beb para interrumpir la succin. Intente comenzar a amamantar nuevamente. Signos de Fish farm manager Signos del beb:   Disminuye gradualmente el nmero de succiones o cesa la succin por completo.  Se duerme.  Relaja el cuerpo.  Retiene una pequea cantidad de [facility] boca.  Se desprende solo del pecho. Signos que presenta usted:  Las mamas han aumentado la firmeza, el peso y el tamao 1 a 3 horas despus de Museum/gallery exhibitions officer.  Estn ms blandas inmediatamente despus de amamantar.  Un aumento del volumen de [place], y tambin un cambio en su consistencia y color se producen hacia el quinto da de Tour manager.  Los pezones no duelen, ni estn agrietados ni sangran. Signos de que su beb recibe la cantidad de leche suficiente  Moja al menos 3 paales en 24 horas. La orina debe ser clara y de color amarillo plido a los [address]  [address] de [place].  Defeca al menos 3 veces en 24 horas a los [address]  [address] de [address] La materia fecal debe ser blanda y [address].  Defeca al menos 3 veces en 24 horas a los [address] de [address] La materia fecal debe ser grumosa y [place].  No registra una prdida de peso mayor del 10% del peso al nacer durante los primeros 3 [address]  [address] de [place].  Aumenta de peso un promedio de 4 a 7onzas (113 a 198g) por semana despus de los [address] de vida.  Aumenta de [place], [place], de [place] uniforme a Glass blower/designer de los [address] de vida, sin Passenger transport manager prdida de peso despus de las 2semanas de vida. Despus de alimentarse, es posible que el beb regurgite una pequea cantidad. Esto es frecuente. FRECUENCIA Y DURACIN DE LA LACTANCIA MATERNA El amamantamiento frecuente la ayudar a producir ms [place] y a Education officer, community de  Engineer, mining en los pezones e hinchazn en las [place]. Alimente al beb cuando muestre signos de hambre o si siente la necesidad de reducir la congestin de las [place]. Esto se denomina "lactancia a demanda". Evite el uso del chupete mientras trabaja para establecer la lactancia (las primeras 4 a 6 semanas despus del nacimiento del beb). Despus de este perodo, podr ofrecerle un chupete. Las investigaciones demostraron que el uso del chupete durante el primer ao de vida del beb disminuye el riesgo de desarrollar el sndrome de muerte sbita del lactante (SMSL). Permita que el nio se alimente en cada mama todo lo que desee. Contine amamantando al beb hasta que haya terminado de alimentarse. Cuando el beb se desprende o se queda dormido mientras se est alimentando de la primera mama, ofrzcale la segunda. Debido a que, con frecuencia, los recin [place] las primeras semanas de vida, es posible que deba despertar al beb para alimentarlo. Los horarios de Acupuncturist de un beb a otro. Sin embargo, las siguientes reglas pueden servir como gua para ayudarla a Lawyer que el beb se alimenta adecuadamente:  Se puede amamantar a los recin nacidos (bebs de 4 semanas o menos de vida) cada 1 a 3 horas.  No deben transcurrir ms de 3 horas durante el da o 5 horas durante la noche sin que se amamante a los recin nacidos.  Debe amamantar al beb 8 veces como mnimo en un perodo de 24 horas, hasta que comience a introducir slidos en su dieta, a los 6 meses  de vida aproximadamente. EXTRACCIN DE [employer] MATERNA La extraccin y Contractor de la leche materna le permiten asegurarse de que el beb se alimente exclusivamente de [place], aun en momentos en los que no puede amamantar. Esto tiene especial importancia si debe regresar al Aleen Campi en el perodo en que an est amamantando o si no puede estar presente en los momentos en que el beb debe alimentarse. Su asesor en  lactancia puede orientarla sobre cunto tiempo es seguro almacenar [place].  El sacaleche es un aparato que le permite extraer leche de la mama a un recipiente estril. Luego, la leche materna extrada puede almacenarse en un refrigerador o Electrical engineer. Algunos sacaleches son Birdie Riddle, Delaney Meigs otros son elctricos. Consulte a su asesor en lactancia qu tipo ser ms conveniente para usted. Los sacaleches se pueden comprar; sin embargo, algunos hospitales y grupos de apoyo a la lactancia materna alquilan Sports coach. Un asesor en lactancia puede ensearle cmo extraer [employer], en caso de que prefiera no usar un sacaleche.  CMO CUIDAR LAS MAMAS DURANTE LA LACTANCIA MATERNA Los pezones se secan, agrietan y duelen durante la Tour manager. Las siguientes recomendaciones pueden ayudarla a Pharmacologist las [employer] y sanas:  Careers information officer usar jabn en los pezones.  Use un sostn de soporte. Aunque no son esenciales, las camisetas sin mangas o los sostenes especiales para Museum/gallery exhibitions officer estn diseados para acceder fcilmente a las mamas, para Museum/gallery exhibitions officer sin tener que quitarse todo el sostn o la camiseta. Evite usar sostenes con aro o sostenes muy ajustados.  Seque al aire sus pezones durante 3 a despus de amamantar al beb.  Utilice solo apsitos de Haematologist sostn para Environmental health practitioner las prdidas de [place]. La prdida de un poco de [employer] tomas es normal.  Utilice lanolina sobre los pezones luego de Museum/gallery exhibitions officer. La lanolina ayuda a mantener la humedad normal de la piel. Si [place] lanolina pura, no tiene que lavarse los pezones antes de volver a Corporate treasurer al beb. La lanolina pura no es txica para el beb. Adems, puede extraer [employer] algunas gotas de [place] materna y Engineer, maintenance (IT) suavemente esa [employer], para que la [place] se seque al aire. Durante las primeras semanas despus de dar a luz, algunas mujeres pueden experimentar hinchazn  en las mamas (congestin [place]). La congestin puede hacer que sienta las mamas pesadas, calientes y sensibles al tacto. El pico de la congestin ocurre dentro de los 3 a 5 das despus del [place]. Las siguientes recomendaciones pueden ayudarla a Paramedic la congestin:  Vace por completo las mamas al [employer] o Environmental health practitioner. Puede aplicar calor hmedo en las mamas (en la ducha o con toallas hmedas para manos) antes de Museum/gallery exhibitions officer o extraer [employer]. Esto aumenta la circulacin y [place] a que la [place]. Si el beb no vaca por completo las [address] cuando lo [address], extraiga la [place] restante despus de que haya finalizado.  Use un sostn ajustado (para amamantar o comn) o una camiseta sin mangas durante 1 o 2 das para indicar al cuerpo que disminuya ligeramente la produccin de [place].  Aplique compresas de hielo [employer], a menos que le resulte demasiado incmodo.  Asegrese de que el beb est prendido y se encuentre en la posicin correcta mientras lo alimenta. Si la congestin persiste luego de 48 horas o despus de seguir estas recomendaciones, comunquese con su mdico o un Holiday representative. RECOMENDACIONES GENERALES PARA EL CUIDADO DE LA SALUD DURANTE LA LACTANCIA  MATERNA  Consuma alimentos saludables. Alterne comidas y colaciones, y coma 3 de cada una por da. Dado que lo que come Danaher Corporationafecta la leche materna, es posible que algunas comidas hagan que su beb se vuelva ms irritable de lo habitual. Evite comer este tipo de alimentos si percibe que afectan de manera negativa al beb.  Beba leche, jugos de fruta y agua para Patent examinersatisfacer su sed (aproximadamente 10 vasos al Futures traderda).  Descanse con frecuencia, reljese y tome sus vitaminas prenatales para evitar la fatiga, el estrs y la anemia.  Contine con los autocontroles de la mama.  Evite Product managermasticar y fumar tabaco. Las sustancias qumicas de los cigarrillos que pasan a la leche materna y la exposicin al humo ambiental del tabaco pueden  daar al beb.  No consuma alcohol ni drogas, incluida la marihuana. Algunos medicamentos, que pueden ser perjudiciales para el beb, pueden pasar a travs de la Colgate Palmoliveleche materna. Es importante que consulte a su mdico antes de Medical sales representativetomar cualquier medicamento, incluidos todos los medicamentos recetados y de Moselleventa libre, as como los suplementos vitamnicos y herbales. Puede quedar embarazada durante la lactancia. Si desea controlar la natalidad, consulte a su mdico cules son las opciones ms seguras para el beb. SOLICITE ATENCIN MDICA SI:   Usted siente que quiere dejar de Museum/gallery exhibitions officeramamantar o se siente frustrada con la lactancia.  Siente dolor en las mamas o en los pezones.  Sus pezones estn agrietados o Water quality scientistsangran.  Sus pechos estn irritados, sensibles o calientes.  Tiene un rea hinchada en cualquiera de las mamas.  Siente escalofros o fiebre.  Tiene nuseas o vmitos.  Presenta una secrecin de otro lquido distinto de la leche materna de los pezones.  Sus mamas no se llenan antes de Museum/gallery exhibitions officeramamantar al beb para el quinto da despus del [place].  Se siente triste y deprimida.  El beb est demasiado somnoliento como para comer bien.  El beb tiene problemas para dormir.  Moja menos de 3 paales en 24 horas.  Defeca menos de 3 veces en 24 horas.  La piel del beb o la parte blanca de los ojos se vuelven amarillentas.  El beb no ha aumentado de [place] a los [address] das de [place]ida. SOLICITE ATENCIN MDICA DE INMEDIATO SI:   El beb est muy cansado Retail buyer(letargo) y no se quiere despertar para comer.  Le sube la fiebre sin causa.   Esta informacin no tiene Theme park managercomo fin reemplazar el consejo del mdico. Asegrese de hacerle al mdico cualquier pregunta que tenga.   Document Released: 03/07/2005 Document Revised: 11/26/2014 Elsevier Interactive Patient Education Yahoo[employer] Inc2016 Elsevier Inc.

## 2015-03-26 NOTE — Progress Notes (Signed)
   Subjective:    Whitney Petty is a G3P1011 4438w4d being seen today for her first obstetrical visit.  Her obstetrical history is significant for advanced maternal age, obesity, pre-eclampsia and depression. Patient does intend to breast feed. Pregnancy history fully reviewed.  Patient reports feeling depressed at times. She denies any suicidal/homicidal ideations. She states that she is trying to work through her emotions.  Filed Vitals:   03/26/15 1007  BP: 125/84  Pulse: 93  Weight: 199 lb (90.266 kg)    HISTORY: OB History  Gravida Para Term Preterm AB SAB TAB Ectopic Multiple Living  3 1 1  1 1    1     # Outcome Date GA Lbr Len/2nd Weight Sex Delivery Anes PTL Lv  3 Current           2 Term 06/19/12    M Vag-Spont   Y     Comments: System Generated. Please review and update pregnancy details.  1 SAB              History reviewed. No pertinent past medical history. History reviewed. No pertinent past surgical history. Family History  Problem Relation Age of Onset  . Diabetes Mother      Exam    Uterus:     Pelvic Exam:    Perineum: Normal Perineum   Vulva: normal   Vagina:  normal mucosa, normal discharge   pH:    Cervix: multiparous appearance and closed and long   Adnexa: no mass, fullness, tenderness   Bony Pelvis: gynecoid  System: Breast:  normal appearance, no masses or tenderness   Skin: normal coloration and turgor, no rashes    Neurologic: oriented, no focal deficits   Extremities: normal strength, tone, and muscle mass   HEENT extra ocular movement intact   Mouth/Teeth mucous membranes moist, pharynx normal without lesions and dental hygiene good   Neck supple and no masses   Cardiovascular: regular rate and rhythm   Respiratory:  chest clear, no wheezing, crepitations, rhonchi, normal symmetric air entry   Abdomen: soft, non-tender; bowel sounds normal; no masses,  no organomegaly   Urinary:       Assessment:    Pregnancy: G3P1011 Patient  Active Problem List   Diagnosis Date Noted  . Supervision of other high risk pregnancy, antepartum 03/26/2015  . AMA (advanced maternal age) multigravida 35+ 03/26/2015  . History of gestational hypertension 03/26/2015  . Obesity affecting pregnancy, antepartum 03/26/2015  . Obesity (BMI 35.0-39.9 without comorbidity) (HCC) 03/26/2015  . Depression affecting pregnancy, antepartum 03/26/2015  . SAB (spontaneous abortion) 09/09/2013        Plan:     Initial labs drawn. Prenatal vitamins. Problem list reviewed and updated. Genetic Screening discussed : declined due to cost  Ultrasound discussed; fetal survey: requested. Discussed reducing risk of pre-eclampsia with daily ASA starting at 12 weeks. Patient agrees. Rx provided Patient declined referral to genetic counselor Early 1 hour glucola today secondary to BMI 40 Patient declined referral to BH/psychiatrist or Rx zoloft today. She will let us know when she is ready for interventions  Follow up in 4 weeks. 50% of 30 min visit spent on counseling and coordination of care.     Whitney Petty 03/26/2015

## 2015-03-27 LAB — PRENATAL PROFILE (SOLSTAS)
ANTIBODY SCREEN: NEGATIVE
BASOS ABS: 0 10*3/uL (ref 0.0–0.1)
Basophils Relative: 0 % (ref 0–1)
EOS ABS: 0.1 10*3/uL (ref 0.0–0.7)
EOS PCT: 1 % (ref 0–5)
HEMATOCRIT: 38.5 % (ref 36.0–46.0)
HIV 1&2 Ab, 4th Generation: NONREACTIVE
Hemoglobin: 12.5 g/dL (ref 12.0–15.0)
Hepatitis B Surface Ag: NEGATIVE
Lymphocytes Relative: 18 % (ref 12–46)
Lymphs Abs: 1.8 10*3/uL (ref 0.7–4.0)
MCH: 25.4 pg — ABNORMAL LOW (ref 26.0–34.0)
MCHC: 32.5 g/dL (ref 30.0–36.0)
MCV: 78.1 fL (ref 78.0–100.0)
MPV: 9.5 fL (ref 8.6–12.4)
Monocytes Absolute: 0.4 10*3/uL (ref 0.1–1.0)
Monocytes Relative: 4 % (ref 3–12)
Neutro Abs: 7.6 10*3/uL (ref 1.7–7.7)
Neutrophils Relative %: 77 % (ref 43–77)
Platelets: 289 10*3/uL (ref 150–400)
RBC: 4.93 MIL/uL (ref 3.87–5.11)
RDW: 18.1 % — ABNORMAL HIGH (ref 11.5–15.5)
RH TYPE: POSITIVE
Rubella: 4.34 Index — ABNORMAL HIGH (ref <0.90)
WBC: 9.9 10*3/uL (ref 4.0–10.5)

## 2015-03-27 LAB — GLUCOSE TOLERANCE, 1 HOUR (50G) W/O FASTING: Glucose, 1 Hour GTT: 139 mg/dL (ref 70–140)

## 2015-03-27 LAB — CULTURE, OB URINE
Colony Count: NO GROWTH
ORGANISM ID, BACTERIA: NO GROWTH

## 2015-03-30 ENCOUNTER — Telehealth: Payer: Self-pay | Admitting: *Deleted

## 2015-03-30 LAB — CYTOLOGY - PAP

## 2015-03-30 IMAGING — US US OB TRANSVAGINAL
1 series · 14 of 28 positions shown · non-contrast
Comparison: None.

CLINICAL DATA: Pelvic pain in pregnancy.

EXAM:
OBSTETRIC <14 WK US AND TRANSVAGINAL OB US
TECHNIQUE: Both transabdominal and transvaginal ultrasound examinations were
performed for complete evaluation of the gestation as well as the
maternal uterus, adnexal regions, and pelvic cul-de-sac.
Transvaginal technique was performed to assess early pregnancy.

[Series 1: us ob transvaginal · 0.21mm/px · 86 acquisitions, 14 frames shown]
[im 4/86]
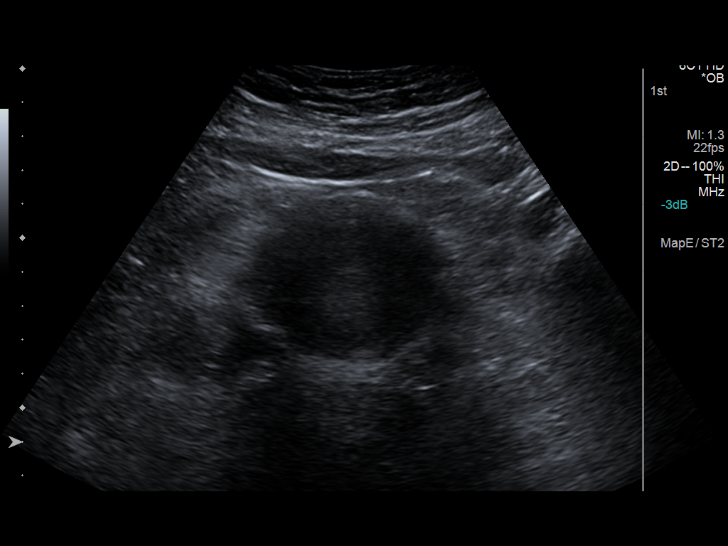
[im 10/86]
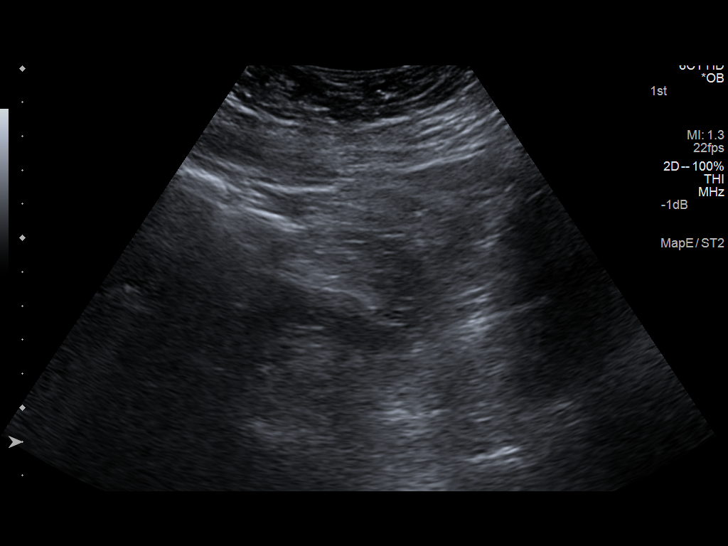
[im 16/86]
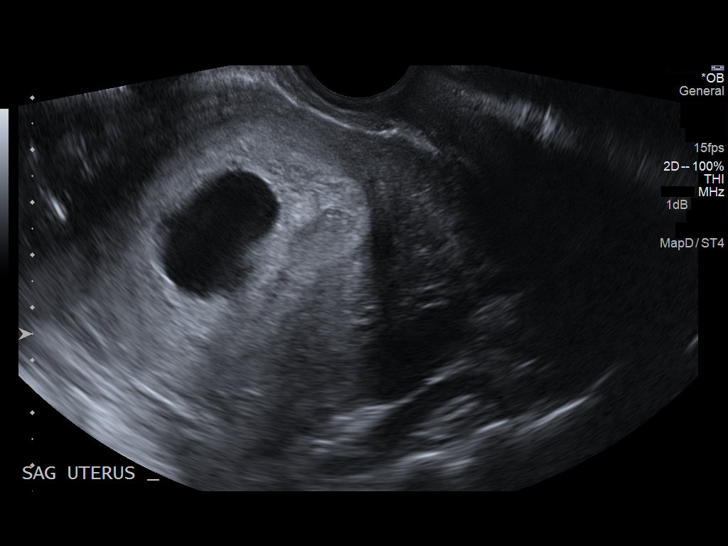
[im 23/86]
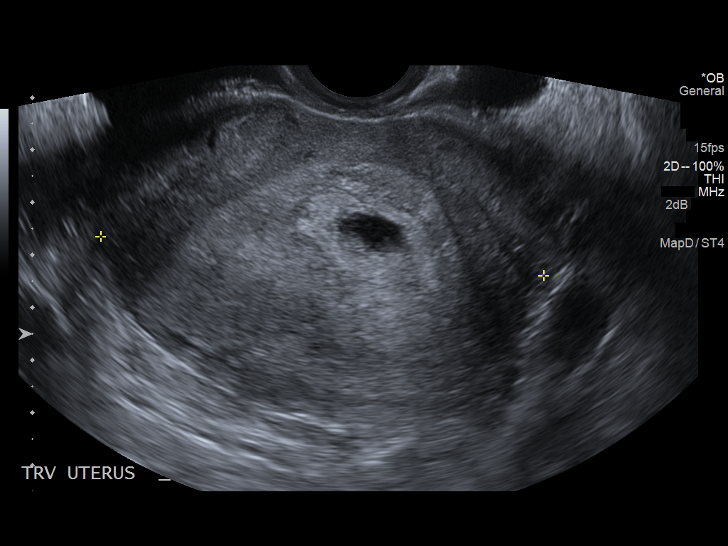
[im 29/86]
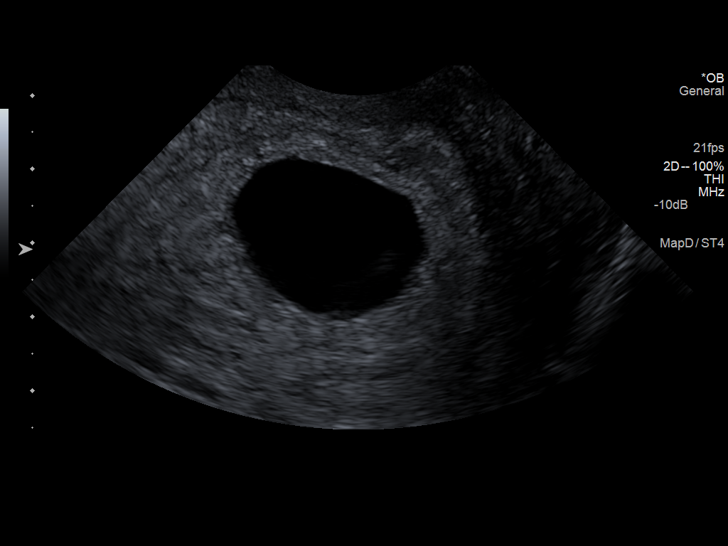
[im 35/86]
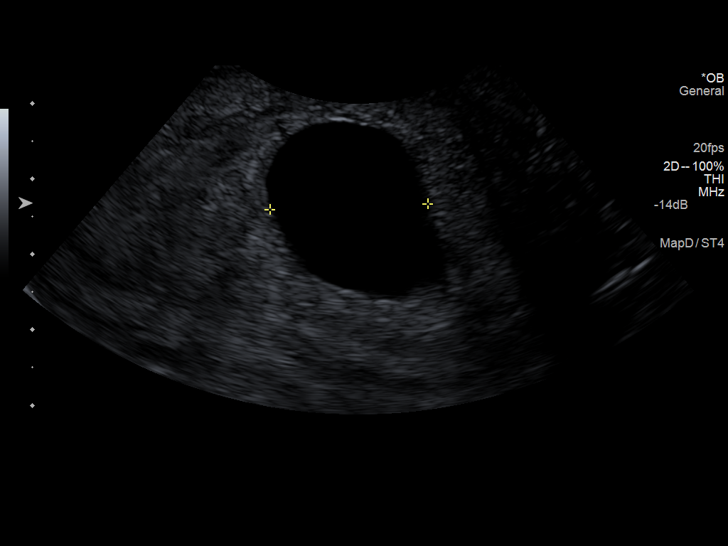
[im 41/86]
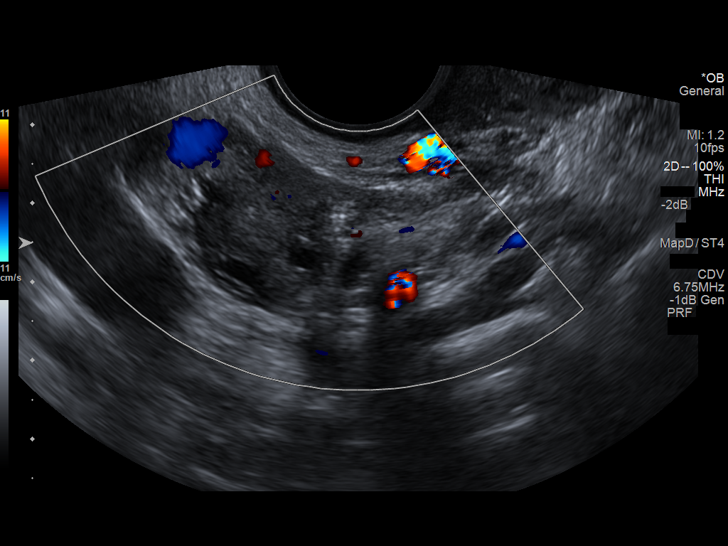
[im 48/86]
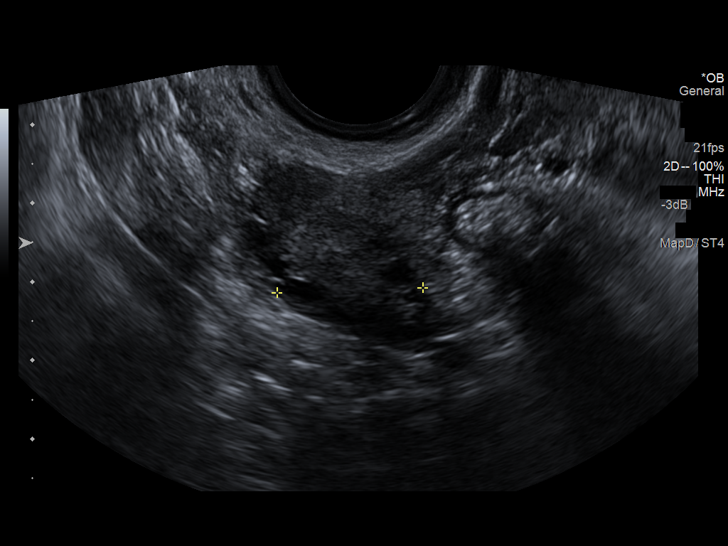
[im 54/86]
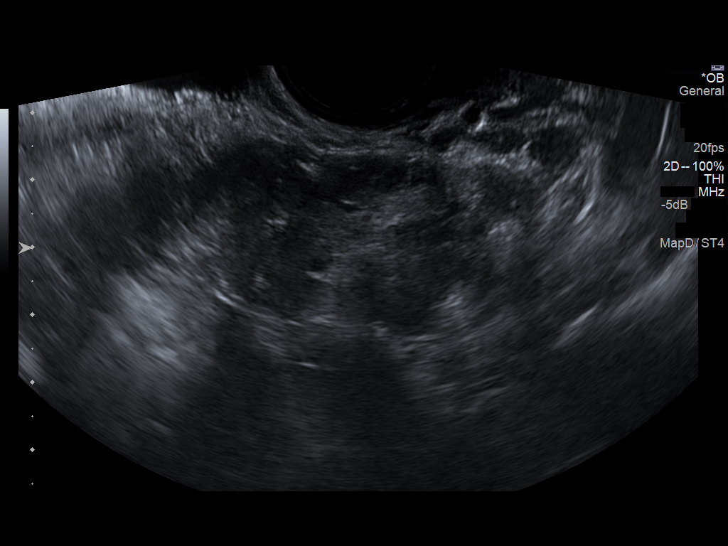
[im 60/86]
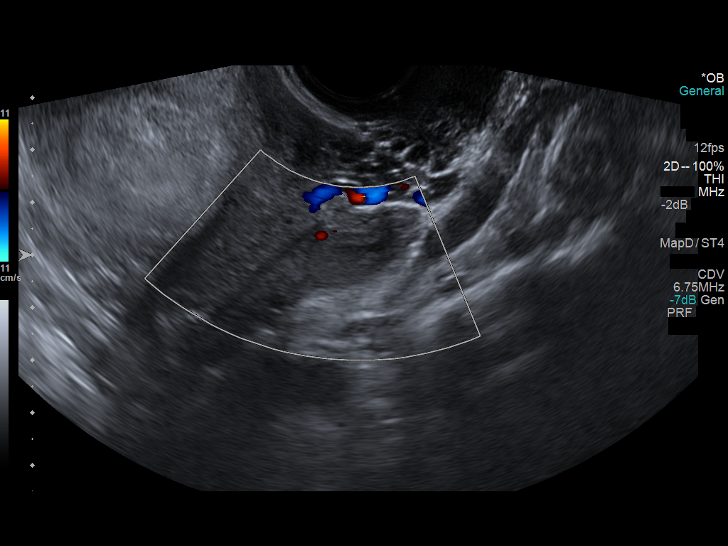
[im 67/86]
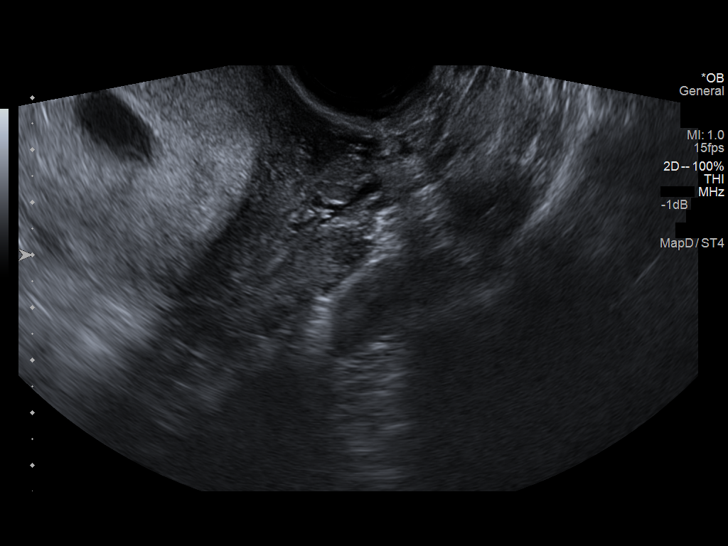
[im 73/86]
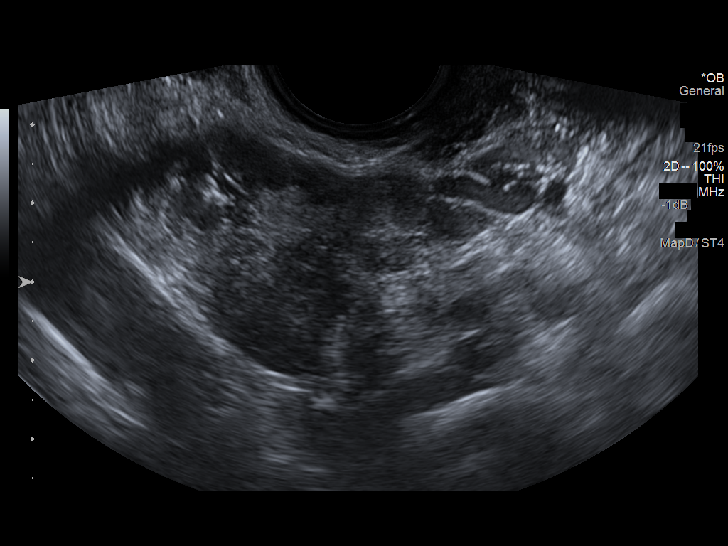
[im 79/86]
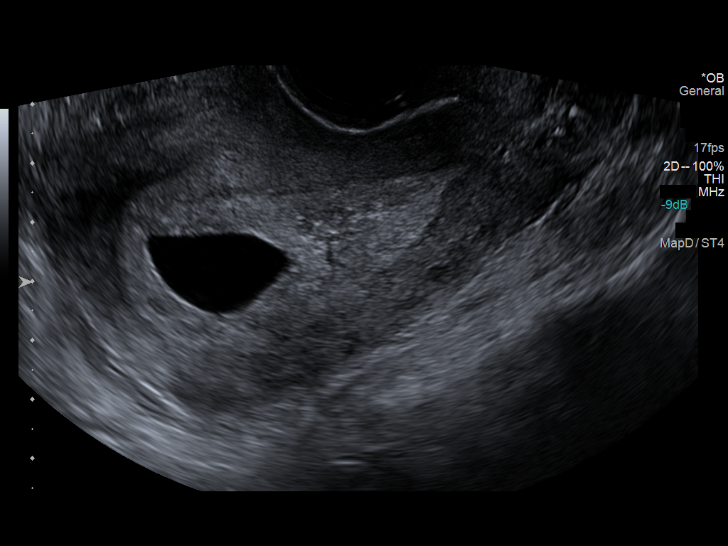
[im 86/86]
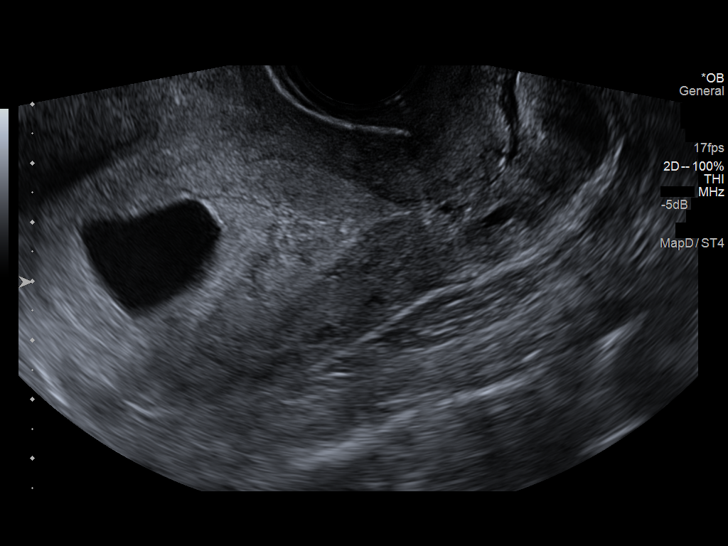

[14 of 28 positions shown; findings below may reference images not displayed]

FINDINGS: Intrauterine gestational sac: Visualized/normal in shape.

Yolk sac:  Not seen

Embryo:  Not seen

MSD:  18  mm

Maternal uterus/adnexae: The ovaries are symmetric in size and
normal in appearance. Unremarkable uterus. No free pelvic fluid. No
subchorionic hematoma.
IMPRESSION: Intrauterine gestation, mean sac diameter 18 mm. No fetal contents
yet visible; findings are suspicious but not yet definitive for
failed pregnancy. Recommend follow-up US in 10-14 days for
definitive diagnosis. This recommendation follows SRU consensus
guidelines: Diagnostic Criteria for Nonviable Pregnancy Early in the
First Trimester. N Engl J Med 7810; [DATE].

## 2015-03-30 NOTE — Telephone Encounter (Signed)
Called pt, no answer, left message to call office.

## 2015-03-30 NOTE — Telephone Encounter (Signed)
-----   Message from Catalina AntiguaPeggy Constant, MD sent at 03/27/2015 10:31 AM EST ----- Please inform patient that she failed 1 hour GCT and needs to come in for a 3 hour test prior to her next appointment  Thanks  Peggy

## 2015-04-22 ENCOUNTER — Ambulatory Visit (INDEPENDENT_AMBULATORY_CARE_PROVIDER_SITE_OTHER): Payer: Self-pay | Admitting: Certified Nurse Midwife

## 2015-04-22 VITALS — BP 112/82 | HR 92 | Wt 202.0 lb

## 2015-04-22 DIAGNOSIS — O09892 Supervision of other high risk pregnancies, second trimester: Secondary | ICD-10-CM

## 2015-04-22 DIAGNOSIS — O09522 Supervision of elderly multigravida, second trimester: Secondary | ICD-10-CM

## 2015-04-22 NOTE — Progress Notes (Signed)
Subjective:  Whitney Petty is a 35 y.o. G3P1011 at [redacted]w[redacted]d being seen today for ongoing prenatal care.  She is currently monitored for the following issues for this high-risk pregnancy and has SAB (spontaneous abortion); Supervision of other high risk pregnancy, antepartum; AMA (advanced maternal age) multigravida 35+; History of gestational hypertension; Obesity affecting pregnancy, antepartum; Obesity (BMI 35.0-39.9 without comorbidity) (HCC); and Depression affecting pregnancy, antepartum on her problem list.  Patient reports no complaints.  Contractions: Not present. Vag. Bleeding: None.  Movement: Present. Denies leaking of fluid.   The following portions of the patient's history were reviewed and updated as appropriate: allergies, current medications, past family history, past medical history, past social history, past surgical history and problem list. Problem list updated.  Objective:   Filed Vitals:   04/22/15 1309  BP: 112/82  Pulse: 92  Weight: 202 lb (91.627 kg)    Fetal Status: Fetal Heart Rate (bpm): 151   Movement: Present     General:  Alert, oriented and cooperative. Patient is in no acute distress.  Skin: Skin is warm and dry. No rash noted.   Cardiovascular: Normal heart rate noted  Respiratory: Normal respiratory effort, no problems with respiration noted  Abdomen: Soft, gravid, appropriate for gestational age. Pain/Pressure: Absent     Pelvic: Vag. Bleeding: None Vag D/C Character: Thin   Cervical exam deferred        Extremities: Normal range of motion.  Edema: None  Mental Status: Normal mood and affect. Normal behavior. Normal judgment and thought content.   Urinalysis: Urine Protein: 1+ Urine Glucose: Negative  Assessment and Plan:  Pregnancy: G3P1011 at [redacted]w[redacted]d  1. AMA (advanced maternal age) multigravida 35+, second trimester  - US OB Comp + 80 Wk; Future  2. Supervision of other high risk pregnancy, antepartum, second trimester Come back tomorrow for 3  hour gtt - US OB Comp + 14 Wk; Future  Preterm labor symptoms and general obstetric precautions including but not limited to vaginal bleeding, contractions, leaking of fluid and fetal movement were reviewed in detail with the patient. Please refer to After Visit Summary for other counseling recommendations.  Return in about 4 weeks (around 05/20/2015).   Rhea Pink, CNM

## 2015-04-22 NOTE — Patient Instructions (Signed)
Gestational Diabetes Mellitus  Gestational diabetes mellitus, often simply referred to as gestational diabetes, is a type of diabetes that some women develop during pregnancy. In gestational diabetes, the pancreas does not make enough insulin (a hormone), the cells are less responsive to the insulin that is made (insulin resistance), or both. Normally, insulin moves sugars from food into the tissue cells. The tissue cells use the sugars for energy. The lack of insulin or the lack of normal response to insulin causes excess sugars to build up in the blood instead of going into the tissue cells. As a result, high blood sugar (hyperglycemia) develops. The effect of high sugar (glucose) levels can cause many problems.   RISK FACTORS  You have an increased chance of developing gestational diabetes if you have a family history of diabetes and also have one or more of the following risk factors:  · A body mass index over 30 (obesity).  · A previous pregnancy with gestational diabetes.  · An older age at the time of pregnancy.  If blood glucose levels are kept in the normal range during pregnancy, women can have a healthy pregnancy. If your blood glucose levels are not well controlled, there may be risks to you, your unborn baby (fetus), your labor and delivery, or your newborn baby.   SYMPTOMS   If symptoms are experienced, they are much like symptoms you would normally expect during pregnancy. The symptoms of gestational diabetes include:   · Increased thirst (polydipsia).  · Increased urination (polyuria).  · Increased urination during the night (nocturia).  · Weight loss. This weight loss may be rapid.  · Frequent, recurring infections.  · Tiredness (fatigue).  · Weakness.  · Vision changes, such as blurred vision.  · Fruity smell to your breath.  · Abdominal pain.  DIAGNOSIS  Diabetes is diagnosed when blood glucose levels are increased. Your blood glucose level may be checked by one or more of the following blood  tests:  · A fasting blood glucose test. You will not be allowed to eat for at least 8 hours before a blood sample is taken.  · A random blood glucose test. Your blood glucose is checked at any time of the day regardless of when you ate.  · An oral glucose tolerance test (OGTT). Your blood glucose is measured after you have not eaten (fasted) for 1-3 hours and then after you drink a glucose-containing beverage. Since the hormones that cause insulin resistance are highest at about 24-28 weeks of a pregnancy, an OGTT is usually performed during that time. If you have risk factors, you may be screened for undiagnosed type 2 diabetes at your first prenatal visit.  TREATMENT   Gestational diabetes should be managed first with diet and exercise. Medicines may be added only if they are needed.  · You will need to take diabetes medicine or insulin daily to keep blood glucose levels in the desired range.  · You will need to match insulin dosing with exercise and healthy food choices.  If you have gestational diabetes, your treatment goal is to maintain the following blood glucose levels:  · Before meals (preprandial): at or below 95 mg/dL.  · After meals (postprandial):    One hour after a meal: at or below 140 mg/dL.    Two hours after a meal: at or below 120 mg/dL.  If you have pre-existing type 1 or type 2 diabetes, your treatment goal is to maintain the following blood glucose levels:  · Before   meals, at bedtime, and overnight: 60-99 mg/dL.  · After meals: peak of 100-129 mg/dL.  HOME CARE INSTRUCTIONS   · Have your hemoglobin A1c level checked twice a year.  · Perform daily blood glucose monitoring as directed by your health care provider. It is common to perform frequent blood glucose monitoring.  · Monitor urine ketones when you are ill and as directed by your health care provider.  · Take your diabetes medicine and insulin as directed by your health care provider to maintain your blood glucose level in the desired  range.  ¨ Never run out of diabetes medicine or insulin. It is needed every day.  ¨ Adjust insulin based on your intake of carbohydrates. Carbohydrates can raise blood glucose levels but need to be included in your diet. Carbohydrates provide vitamins, minerals, and fiber which are an essential part of a healthy diet. Carbohydrates are found in fruits, vegetables, whole grains, dairy products, legumes, and foods containing added sugars.  · Eat healthy foods. Alternate 3 meals with 3 snacks.  · Maintain a healthy weight gain. The usual total expected weight gain varies according to your prepregnancy body mass index (BMI).  · Carry a medical alert card or wear your medical alert jewelry.  · Carry a 15-gram carbohydrate snack with you at all times to treat low blood glucose (hypoglycemia). Some examples of 15-gram carbohydrate snacks include:  ¨ Glucose tablets, 3 or 4.  ¨ Glucose gel, 15-gram tube.  ¨ Raisins, 2 tablespoons (24 g).  ¨ Jelly beans, 6.  ¨ Animal crackers, 8.  ¨ Fruit juice, regular soda, or low-fat milk, 4 ounces (120 mL).  ¨ Gummy treats, 9.  · Recognize hypoglycemia. Hypoglycemia during pregnancy occurs with blood glucose levels of 60 mg/dL and below. The risk for hypoglycemia increases when fasting or skipping meals, during or after intense exercise, and during sleep. Hypoglycemia symptoms can include:  ¨ Tremors or shakes.  ¨ Decreased ability to concentrate.  ¨ Sweating.  ¨ Increased heart rate.  ¨ Headache.  ¨ Dry mouth.  ¨ Hunger.  ¨ Irritability.  ¨ Anxiety.  ¨ Restless sleep.  ¨ Altered speech or coordination.  ¨ Confusion.  · Treat hypoglycemia promptly. If you are alert and able to safely swallow, follow the 15:15 rule:  ¨ Take 15-20 grams of rapid-acting glucose or carbohydrate. Rapid-acting options include glucose gel, glucose tablets, or 4 ounces (120 mL) of fruit juice, regular soda, or low-fat milk.  ¨ Check your blood glucose level 15 minutes after taking the glucose.  ¨ Take 15-20  grams more of glucose if the repeat blood glucose level is still 70 mg/dL or below.  ¨ Eat a meal or snack within 1 hour once blood glucose levels return to normal.  · Be alert to polyuria (excess urination) and polydipsia (excess thirst) which are early signs of hyperglycemia. An early awareness of hyperglycemia allows for prompt treatment. Treat hyperglycemia as directed by your health care provider.  · Engage in at least 30 minutes of physical activity a day or as directed by your health care provider. Ten minutes of physical activity timed 30 minutes after each meal is encouraged to control postprandial blood glucose levels.  · Adjust your insulin dosing and food intake as needed if you start a new exercise or sport.  · Follow your sick-day plan at any time you are unable to eat or drink as usual.  · Avoid tobacco and alcohol use.  · Keep all follow-up visits as directed   by your health care provider.  · Follow the advice of your health care provider regarding your prenatal and post-delivery (postpartum) appointments, meal planning, exercise, medicines, vitamins, blood tests, other medical tests, and physical activities.  · Perform daily skin and foot care. Examine your skin and feet daily for cuts, bruises, redness, nail problems, bleeding, blisters, or sores.  · Brush your teeth and gums at least twice a day and floss at least once a day. Follow up with your dentist regularly.  · Schedule an eye exam during the first trimester of your pregnancy or as directed by your health care provider.  · Share your diabetes management plan with your workplace or school.  · Stay up-to-date with immunizations.  · Learn to manage stress.  · Obtain ongoing diabetes education and support as needed.  · Learn about and consider breastfeeding your baby.  · You should have your blood sugar level checked 6-12 weeks after delivery. This is done with an oral glucose tolerance test (OGTT).  SEEK MEDICAL CARE IF:   · You are unable to  eat food or drink fluids for more than 6 hours.  · You have nausea and vomiting for more than 6 hours.  · You have a blood glucose level of 200 mg/dL and you have ketones in your urine.  · There is a change in mental status.  · You develop vision problems.  · You have a persistent headache.  · You have upper abdominal pain or discomfort.  · You develop an additional serious illness.  · You have diarrhea for more than 6 hours.  · You have been sick or have had a fever for a couple of days and are not getting better.  SEEK IMMEDIATE MEDICAL CARE IF:   · You have difficulty breathing.  · You no longer feel the baby moving.  · You are bleeding or have discharge from your vagina.  · You start having premature contractions or labor.  MAKE SURE YOU:  · Understand these instructions.  · Will watch your condition.  · Will get help right away if you are not doing well or get worse.     This information is not intended to replace advice given to you by your health care provider. Make sure you discuss any questions you have with your health care provider.     Document Released: 06/13/2000 Document Revised: 03/28/2014 Document Reviewed: 10/04/2011  Elsevier Interactive Patient Education ©2016 [employer]

## 2015-04-23 ENCOUNTER — Other Ambulatory Visit (INDEPENDENT_AMBULATORY_CARE_PROVIDER_SITE_OTHER): Payer: Self-pay | Admitting: *Deleted

## 2015-04-23 DIAGNOSIS — R7309 Other abnormal glucose: Secondary | ICD-10-CM

## 2015-04-23 DIAGNOSIS — R7302 Impaired glucose tolerance (oral): Secondary | ICD-10-CM

## 2015-04-23 NOTE — Progress Notes (Signed)
Patient here for a 3 hr GTT today.

## 2015-04-24 LAB — GLUCOSE TOLERANCE, 3 HOURS
GLUCOSE, 1 HOUR-GESTATIONAL: 139 mg/dL (ref 70–189)
GLUCOSE, 2 HOUR-GESTATIONAL: 95 mg/dL (ref 70–164)
Glucose Tolerance, Fasting: 71 mg/dL (ref 65–99)
Glucose, GTT - 3 Hour: 79 mg/dL (ref 70–144)

## 2015-05-11 ENCOUNTER — Encounter: Payer: Self-pay | Admitting: *Deleted

## 2015-05-11 ENCOUNTER — Telehealth: Payer: Self-pay | Admitting: *Deleted

## 2015-05-11 NOTE — Telephone Encounter (Signed)
-----   Message from Olevia Bowens sent at 05/11/2015  8:25 AM EST ----- Regarding: Results Contact: [phone] Wants to know the results of her GTT

## 2015-05-11 NOTE — Telephone Encounter (Signed)
Called pt, no answer, left message with normal 3 hr GTT result and for pt to call back if she had any further questions.

## 2015-05-12 ENCOUNTER — Telehealth: Payer: Self-pay | Admitting: *Deleted

## 2015-05-12 ENCOUNTER — Encounter: Payer: Self-pay | Admitting: Obstetrics and Gynecology

## 2015-05-12 DIAGNOSIS — O4400 Placenta previa specified as without hemorrhage, unspecified trimester: Secondary | ICD-10-CM | POA: Insufficient documentation

## 2015-05-12 NOTE — Telephone Encounter (Signed)
-----   Message from Catalina Antigua, MD sent at 05/12/2015  1:53 PM EST ----- Please inform patient of placenta previa. She should abstain from intercourse until repeat ultrasound at 28 weeks  Thanks  Peggy

## 2015-05-12 NOTE — Telephone Encounter (Signed)
Informed pt of results and restrictions via [employer], pt had no further questions, will schedule [place] at next appt.

## 2015-05-20 ENCOUNTER — Encounter: Payer: Self-pay | Admitting: Obstetrics & Gynecology

## 2015-05-20 ENCOUNTER — Ambulatory Visit (INDEPENDENT_AMBULATORY_CARE_PROVIDER_SITE_OTHER): Payer: Self-pay | Admitting: Obstetrics & Gynecology

## 2015-05-20 ENCOUNTER — Encounter (INDEPENDENT_AMBULATORY_CARE_PROVIDER_SITE_OTHER): Payer: Self-pay

## 2015-05-20 VITALS — BP 111/78 | HR 89 | Wt 198.0 lb

## 2015-05-20 DIAGNOSIS — O4412 Placenta previa with hemorrhage, second trimester: Secondary | ICD-10-CM

## 2015-05-20 DIAGNOSIS — O4402 Placenta previa specified as without hemorrhage, second trimester: Secondary | ICD-10-CM

## 2015-05-20 DIAGNOSIS — O09892 Supervision of other high risk pregnancies, second trimester: Secondary | ICD-10-CM

## 2015-05-20 NOTE — Progress Notes (Signed)
Subjective:  Whitney Petty is a 35 y.o. G3P1011 at [redacted]w[redacted]d being seen today for ongoing prenatal care.  She is currently monitored for the following issues for this high-risk pregnancy and has SAB (spontaneous abortion); Supervision of other high risk pregnancy, antepartum; AMA (advanced maternal age) multigravida 35+; History of gestational hypertension; Obesity affecting pregnancy, antepartum; Obesity (BMI 35.0-39.9 without comorbidity) (HCC); Depression affecting pregnancy, antepartum; and Placenta previa antepartum on her problem list.  Patient reports no complaints.  Contractions: Not present. Vag. Bleeding: None.  Movement: Present. Denies leaking of fluid.   The following portions of the patient's history were reviewed and updated as appropriate: allergies, current medications, past family history, past medical history, past social history, past surgical history and problem list. Problem list updated.  Objective:   Filed Vitals:   05/20/15 0859  BP: 111/78  Pulse: 89  Weight: 198 lb (89.812 kg)    Fetal Status: Fetal Heart Rate (bpm): 142 Fundal Height: 20 cm Movement: Present     General:  Alert, oriented and cooperative. Patient is in no acute distress.  Skin: Skin is warm and dry. No rash noted.   Cardiovascular: Normal heart rate noted  Respiratory: Normal respiratory effort, no problems with respiration noted  Abdomen: Soft, gravid, appropriate for gestational age. Pain/Pressure: Present     Pelvic: Vag. Bleeding: None Vag D/C Character: Thin   Cervical exam deferred        Extremities: Normal range of motion.  Edema: None  Mental Status: Normal mood and affect. Normal behavior. Normal judgment and thought content.   Urinalysis: Urine Protein: Trace Urine Glucose: Negative  Assessment and Plan:  Pregnancy: G3P1011 at [redacted]w[redacted]d  1. Placenta previa antepartum, second trimester Needs f/u US in MFM Bleeding precautions, pelvic rest advised 2. Supervision of other high risk  pregnancy, antepartum, second trimester AMA  Preterm labor symptoms and general obstetric precautions including but not limited to vaginal bleeding, contractions, leaking of fluid and fetal movement were reviewed in detail with the patient. Please refer to After Visit Summary for other counseling recommendations.  Return in about 4 weeks (around 06/17/2015).   Adam Phenix, MD

## 2015-05-20 NOTE — Patient Instructions (Signed)
Placenta previa   (Placenta Previa)  La placenta previa es un trastorno que padece una mujer embarazada cuando la placenta se implanta en la parte inferior del útero. La placenta cubre de manera parcial o total la abertura del cuello del útero. Es un problema, ya que el bebé debe pasar a través del cuello del útero durante el parto. Hay tres tipos de placenta previa. Ellos son:   1. Placenta previa marginal. La placenta está cerca del cuello uterino pero no cubre la abertura.  2. Placenta previa parcial. La placenta cubre una parte de la abertura del cuello del útero.  3. Placenta previa completa. La placenta cubre toda la abertura del cuello del útero.    Según el tipo de placenta previa,existe la probabilidad de que la placenta pueda ubicarse en una posición normal y que deje de cubrir el cuello del útero, a medida que el embarazo avanza. Es importante que cumpla con todos los controles prenatales.   FACTORES DE RIESGO   Es más probable que sufra placenta previa si:   · Está embarazada de más de un bebé (embarazo múltiple).    · La forma de su útero no es normal.    · Tiene cicatrices en el revestimiento interno del útero.    · Tuvo cirugías anteriores en el útero, como una cesárea.    · Ya ha tenido un bebé.    · Tiene una historia de placenta previa.    · Ha fumado o ingerido cocaína durante el embarazo.    · Está embarazada y tiene 35 años o más.    SÍNTOMAS   El síntoma principal de la placenta previa es un sangrado vaginal indoloro, súbito, durante la segunda mitad del embarazo. La cantidad del sangrado puede ser desde ligeramente leve a abundante. El sangrado puede detenerse por sí mismo, pero siempre vuelve a ocurrir. También puede haber cólicos, contracciones regulares, dolor abdominal y dolor en la cintura.   DIAGNÓSTICO   La placenta previa puede diagnosticarse con una ecografía, encontrando la ubicación de la placenta. La ecografía puede encontrar la placenta previa ya sea durante una visita prenatal de  rutina o luego de que se advierte un sangrado vaginal. Si le diagnostican placenta previa, el médico evitará los exámenes vaginales para reducir el riesgo de un sangrado abundante. Es posible que la placenta previa no se diagnostique hasta que se produzca el sangrado durante el trabajo de parto.   TRATAMIENTO   El tratamiento específico depende de:   · La cantidad de sangrado o si éste se ha detenido.  · En qué etapa se encuentra del embarazo.    · El estado del bebé.    · La ubicación del bebé y de la placenta.    · El tipo de placenta previa.    Según esos factores, el médico podrá indicar:   · Que disminuya la actividad física    · Hacer reposo en cama, en su casa o en el hospital.  · Reposo pélvico Esto significa que no tenga relaciones sexuales, no use tampones, no se haga exámenes pélvicos ni introduzca nada dentro de la vagina.  · Una transfusión sanguínea para reponer la pérdida de sangre materna.  · Una cesárea si el sangrado es abundante y no puede ser controlado o si la placenta cubre completamente el cuello.  · Medicamentos para detener un trabajo de parto prematuro o para que maduren los pulmones del feto si es necesario iniciar el parto antes de que el embarazo llegue a término.    ¿EN QUÉ MOMENTO DEBE BUSCAR ASISTENCIA MÉDICA DE INMEDIATO SI LA   ENVÍAN A SU CASA CON EL DIAGNÓSTICO DE PLACENTA PREVIA?   Solicite atención médica de inmediato si tiene síntomas de placenta previa. Debe concurrir al hospital para ser controlada inmediatamente. Nuevamente, esos síntomas son:   · Sangrado vaginal repentino e indoloro, aún una pequeña cantidad.  · Cólicos o contracciones regulares.  · Dolor en la cintura o en el abdomen.     Esta información no tiene como fin reemplazar el consejo del médico. Asegúrese de hacerle al médico cualquier pregunta que tenga.     Document Released: 12/15/2004 Document Revised: 11/07/2012  Elsevier Interactive Patient Education ©2016 [employer] Inc.

## 2015-06-05 ENCOUNTER — Encounter (HOSPITAL_COMMUNITY): Payer: Self-pay

## 2015-06-05 ENCOUNTER — Ambulatory Visit (HOSPITAL_COMMUNITY)
Admission: RE | Admit: 2015-06-05 | Discharge: 2015-06-05 | Disposition: A | Discharge status: Home / Self Care | Payer: Self-pay | Source: Ambulatory Visit | Attending: Obstetrics & Gynecology | Admitting: Obstetrics & Gynecology

## 2015-06-05 ENCOUNTER — Other Ambulatory Visit: Payer: Self-pay | Admitting: Obstetrics & Gynecology

## 2015-06-05 VITALS — BP 117/79 | HR 101 | Wt 203.4 lb

## 2015-06-05 DIAGNOSIS — Z3689 Encounter for other specified antenatal screening: Secondary | ICD-10-CM

## 2015-06-05 DIAGNOSIS — Z36 Encounter for antenatal screening of mother: Secondary | ICD-10-CM | POA: Insufficient documentation

## 2015-06-05 DIAGNOSIS — O4402 Placenta previa specified as without hemorrhage, second trimester: Secondary | ICD-10-CM | POA: Insufficient documentation

## 2015-06-05 DIAGNOSIS — Z3A21 21 weeks gestation of pregnancy: Secondary | ICD-10-CM

## 2015-06-05 DIAGNOSIS — O99212 Obesity complicating pregnancy, second trimester: Secondary | ICD-10-CM | POA: Insufficient documentation

## 2015-06-05 DIAGNOSIS — O09292 Supervision of pregnancy with other poor reproductive or obstetric history, second trimester: Secondary | ICD-10-CM | POA: Insufficient documentation

## 2015-06-05 DIAGNOSIS — O4400 Placenta previa specified as without hemorrhage, unspecified trimester: Secondary | ICD-10-CM

## 2015-06-05 DIAGNOSIS — O09892 Supervision of other high risk pregnancies, second trimester: Secondary | ICD-10-CM

## 2015-06-05 DIAGNOSIS — O09522 Supervision of elderly multigravida, second trimester: Secondary | ICD-10-CM

## 2015-06-19 ENCOUNTER — Encounter: Payer: Self-pay | Admitting: Family Medicine

## 2015-06-26 ENCOUNTER — Ambulatory Visit (INDEPENDENT_AMBULATORY_CARE_PROVIDER_SITE_OTHER): Payer: Self-pay | Admitting: Obstetrics & Gynecology

## 2015-06-26 VITALS — BP 105/72 | HR 98 | Wt 203.0 lb

## 2015-06-26 DIAGNOSIS — O09892 Supervision of other high risk pregnancies, second trimester: Secondary | ICD-10-CM

## 2015-06-26 DIAGNOSIS — O4412 Placenta previa with hemorrhage, second trimester: Secondary | ICD-10-CM

## 2015-06-26 DIAGNOSIS — R399 Unspecified symptoms and signs involving the genitourinary system: Secondary | ICD-10-CM

## 2015-06-26 DIAGNOSIS — O4402 Placenta previa specified as without hemorrhage, second trimester: Secondary | ICD-10-CM

## 2015-06-26 LAB — POCT URINALYSIS DIPSTICK
Bilirubin, UA: NEGATIVE
Glucose, UA: NEGATIVE
Ketones, UA: NEGATIVE
NITRITE UA: NEGATIVE
RBC UA: NEGATIVE
SPEC GRAV UA: 1.015
UROBILINOGEN UA: 0.2
pH, UA: 6.5

## 2015-06-26 NOTE — Progress Notes (Signed)
Subjective:  Whitney FreezeJohanna Opheim is a 35 y.o. G3P1011 at 8769w5d being seen today for ongoing prenatal care.  She is currently monitored for the following issues for this high-risk pregnancy and has SAB (spontaneous abortion); Supervision of other high risk pregnancy, antepartum; AMA (advanced maternal age) multigravida 35+; History of gestational hypertension; Obesity affecting pregnancy, antepartum; Obesity (BMI 35.0-39.9 without comorbidity) (HCC); Depression affecting pregnancy, antepartum; and Placenta previa antepartum on her problem list.  Patient is Spanish-speaking only, phone Spanish interpreter present for this encounter.   Patient reports urinary symptoms, no fevers, no back pain.  Contractions: Not present. Vag. Bleeding: None.  Movement: Present. Denies leaking of fluid.   The following portions of the patient's history were reviewed and updated as appropriate: allergies, current medications, past family history, past medical history, past social history, past surgical history and problem list. Problem list updated.  Objective:   Filed Vitals:   06/26/15 0854 06/26/15 0908  BP: 105/72 105/72  Pulse: 98 98  Weight: 203 lb (92.08 kg) 203 lb (92.08 kg)    Fetal Status: Fetal Heart Rate (bpm): 150 Fundal Height: 25 cm Movement: Present     General:  Alert, oriented and cooperative. Patient is in no acute distress.  Skin: Skin is warm and dry. No rash noted.   Cardiovascular: Normal heart rate noted  Respiratory: Normal respiratory effort, no problems with respiration noted  Abdomen: Soft, gravid, appropriate for gestational age. Pain/Pressure: Present     Pelvic: Vag. Bleeding: None Vag D/C Character: Thin  Cervical exam deferred        Extremities: Normal range of motion.  Edema: None  Mental Status: Normal mood and affect. Normal behavior. Normal judgment and thought content.   Urinalysis: Urine Protein: 1+ Urine Glucose: Negative Results for orders placed or performed in visit on  06/26/15 (from the past 24 hour(s))  POCT Urinalysis Dipstick     Status: Abnormal   Collection Time: 06/26/15  9:18 AM  Result Value Ref Range   Color, UA Amber    Clarity, UA Clear    Glucose, UA Negative    Bilirubin, UA Negative    Ketones, UA Negative    Spec Grav, UA 1.015    Blood, UA Negative    pH, UA 6.5    Protein, UA Small    Urobilinogen, UA 0.2    Nitrite, UA Negative    Leukocytes, UA small (1+) (A) Negative    Assessment and Plan:  Pregnancy: G3P1011 at 1069w5d  1. Placenta previa antepartum, second trimester Bleeding precautions and pelvic rest precautions emphasized for now. Follow up scan on 07/31/15.  2. Urinary tract infection symptoms Will follow up culture and manage accordingly - POCT Urinalysis Dipstick - Culture, OB Urine  3. Supervision of other high risk pregnancy, antepartum, second trimester Preterm labor symptoms and general obstetric precautions including but not limited to vaginal bleeding, contractions, leaking of fluid and fetal movement were reviewed in detail with the patient. Please refer to After Visit Summary for other counseling recommendations.  Return in about 4 weeks (around 07/24/2015) for 1 hr GTT, 3rd trimester labs, TDap, OB Visit.   Tereso NewcomerUgonna A Alexei Ey, MD

## 2015-06-26 NOTE — Progress Notes (Signed)
Pt c/o discomfort with urination earlier this week, UA + small leukocytes, will send urine cx.

## 2015-06-26 NOTE — Patient Instructions (Addendum)
Regrese a la clinica cuando tenga su cita. Si tiene problemas o preguntas, llama a la clinica o vaya a la [facility]encia [facility]  Placenta previa  (Placenta Previa) La placenta previa es un trastorno que padece una mujer embarazada cuando la placenta se implanta en la parte inferior del tero. La placenta cubre de manera parcial o total la abertura del cuello del tero. Es un problema, ya que el beb debe pasar a travs del cuello del tero durante el [place]. Hay tres tipos de placenta previa. Ellos son:   Placenta previa marginal. La placenta est cerca del cuello uterino pero no cubre la abertura.  Placenta previa parcial. La placenta cubre una parte de la abertura del cuello del tero.  Placenta previa completa. La placenta cubre toda la abertura del cuello del tero.  Segn el tipo de placenta previa,existe la probabilidad de que la placenta pueda ubicarse en una posicin normal y que deje de cubrir el cuello del tero, a medida que el embarazo avanza. Es importante que cumpla con todos los controles prenatales.  FACTORES DE RIESGO  Es ms probable que sufra placenta previa si:   Est embarazada de ms de un beb (embarazo mltiple).   La forma de su tero no es normal.   Tiene cicatrices en el revestimiento interno del tero.   Tuvo cirugas anteriores en el tero, como una cesrea.   Ya ha tenido un beb.   Tiene una historia de placenta previa.   Ha fumado o ingerido cocana Academic librariandurante el embarazo.   Est embarazada y tiene 35 aos o ms.  SNTOMAS  El sntoma principal de la placenta previa es un sangrado vaginal indoloro, sbito, durante la segunda mitad del Gannembarazo. La cantidad del sangrado puede ser desde ligeramente leve a abundante. El sangrado puede detenerse por s mismo, pero siempre vuelve a ocurrir. Tambin puede haber clicos, contracciones regulares, dolor abdominal y Radiographer, therapeuticdolor en la cintura.  DIAGNSTICO  La placenta previa puede  diagnosticarse con una ecografa, encontrando la ubicacin de la placenta. La ecografa puede encontrar la placenta previa ya sea durante una visita prenatal de rutina o luego de que se advierte un sangrado vaginal. Si le diagnostican placenta previa, Financial traderel mdico evitar los exmenes vaginales para reducir el riesgo de un sangrado abundante. Es posible que la placenta previa no se diagnostique hasta que se produzca el sangrado durante el [place].  TRATAMIENTO  El tratamiento especfico depende de:   La cantidad de sangrado o si ste se ha detenido.  En qu etapa se encuentra del embarazo.   El Bonny Doonestado del beb.   La ubicacin del beb y de la placenta.   El tipo de placenta previa.  Segn esos factores, el mdico podr indicar:   Que disminuya la actividad fsica   Hacer reposo en cama, en su casa o en el hospital.  Reposo plvico Esto significa que no tenga relaciones sexuales, no use tampones, no se haga exmenes plvicos ni introduzca nada dentro de la vagina.  Una transfusin sangunea para reponer la prdida de [address] materna.  Una cesrea si el sangrado es abundante y no puede ser controlado o si la placenta cubre completamente el cuello.  Medicamentos para TEFL teacherdetener un trabajo de parto prematuro o para que [place] Tool Worksmaduren los pulmones del feto si es necesario iniciar el parto antes de que el embarazo llegue a trmino.  EN QU MOMENTO DEBE BUSCAR ASISTENCIA MDICA DE INMEDIATO SI LA ENVAN A SU CASA CON EL  DIAGNSTICO DE PLACENTA PREVIA?  Solicite atencin mdica de inmediato si tiene sntomas de placenta previa. Debe concurrir al hospital para ser controlada inmediatamente. Nuevamente, esos sntomas son:   Sangrado vaginal repentino e indoloro, an una pequea cantidad.  Clicos o contracciones regulares.  Dolor en la cintura o en el abdomen.   Esta informacin no tiene Theme park manager el consejo del mdico. Asegrese de hacerle al mdico cualquier pregunta que  tenga. Document Released: 12/15/2004 Document Revised: 11/07/2012 Elsevier Interactive Patient Education 2016 [employer].   [employer] del mtodo anticonceptivo (Contraception Choices) La anticoncepcin (control de la natalidad) es el uso de cualquier mtodo o dispositivo para Location manager. A continuacin se indican algunos de esos mtodos. MTODOS HORMONALES   El Implante contraconceptivo consiste en un tubo plstico delgado que contiene la hormona progesterona. No contiene estrgenos. El mdico inserta el tubo en la parte interna del brazo. El tubo puede Geneticist, molecular durante 3 aos. Despus de los 3 aos debe retirarse. El implante impide que los ovarios liberen vulos (ovulacin), espesa el moco cervical, lo que evita que los espermatozoides ingresen al tero y hace ms delgada la membrana que cubre el interior del tero.  Inyecciones de progesterona sola: las Insurance underwriter cada 3 meses para Location manager. La progesterona sinttica impide que los ovarios liberen vulos. Tambin hacen que el moco cervical se espese y modifique el tejido de recubrimiento interno del tero. Esto hace ms difcil que los espermatozoides sobrevivan en el tero.  Las pldoras anticonceptivas contienen estrgenos y Education officer, museum. Su funcin es [employer] ovarios liberen vulos (ovulacin). Las hormonas de los anticonceptivos orales hacen que el moco cervical se haga ms espeso, lo que evita que el esperma ingrese al tero. Las pldoras anticonceptivas son recetadas por el mdico.Tambin se utilizan para tratar los perodos menstruales abundantes.  Minipldora: este tipo de pldora anticonceptiva contiene slo hormona progesterona. Deben tomarse todos los [address] del mes y debe recetarlas el mdico.  El parche de control de natalidad: contiene hormonas similares a las que contienen las pldoras anticonceptivas. Deben cambiarse una vez por semana y se utilizan bajo prescripcin  mdica.  Anillo vaginal: contiene hormonas similares a las que contienen las pldoras anticonceptivas. Se deja colocado durante tres semanas, se lo retira durante 1 semana y luego se coloca uno nuevo. La paciente debe sentirse cmoda al insertar y retirar el anillo de la vagina.Es necesaria la prescripcin mdica.  Anticonceptivos de emergencia: son mtodos para evitar un embarazo despus de Neomia Dear relacin sexual sin proteccin. Esta pldora puede tomarse inmediatamente despus de Child psychotherapist sexuales o hasta 5 [place] de haber tenido sexo sin proteccin. Es ms efectiva si se toma poco tiempo despus de la relacin sexual. Los anticonceptivos de emergencia estn disponibles sin prescripcin mdica. Consltelo con su farmacutico. No use los anticonceptivos de emergencia como nico mtodo anticonceptivo. MTODOS DE Lenis Noon   Condn masculino: es una vaina delgada (ltex o goma) que se coloca cubriendo al pene durante el acto sexual. Deri Fuelling con espermicida para aumentar la efectividad.  Condn femenino. Es una funda delicada y blanda que se adapta holgadamente a la vagina antes de las Clinical research associate.  Diafragma: es una barrera de ltex redonda y suave que debe ser recomendado por un profesional. Se inserta en la vagina, junto con un gel espermicida. Debe insertarse antes de Management consultant. Debe dejar el diafragma colocado en la vagina durante 6 a 8 horas despus de la relacin sexual.  Capuchn cervical:  es una barrera de ltex o taza plstica redonda y [place] que cubre el cuello del tero y debe ser colocada por un mdico. Puede dejarlo colocado en la vagina hasta 48 horas despus de las relaciones sexuales.  Esponja: es una pieza blanda y circular de espuma de poliuretano. Contiene un espermicida. Se inserta en la vagina despus de mojarla y antes de las [employer].  Espermicidas: son sustancias qumicas que matan o bloquean al esperma y no lo dejan ingresar al cuello  del tero y al tero. Vienen en forma de cremas, geles, supositorios, espuma o comprimidos. No es necesario tener Emergency planning/management officer. Se insertan en la vagina con un aplicador antes de Management consultant. El proceso debe repetirse cada vez que tiene relaciones sexuales. ANTICONCEPTIVOS INTRAUTERINOS  Dispositivo intrauterino (DIU) es un dispositivo en forma de T que se coloca en el tero durante el perodo menstrual, para Location manager. Hay dos tipos:  DIU de cobre: este tipo de DIU est recubierto con un alambre de cobre y se inserta dentro del tero. El cobre hace que el tero y las trompas de Falopio produzcan un liquido que [employer] espermatozoides. Puede permanecer colocado durante 10 aos.  DIU con hormona: este tipo de DIU contiene la hormona progestina (progesterona sinttica). La hormona espesa el moco cervical y evita que los espermatozoides ingresen al tero y tambin afina la membrana que cubre el tero para evitar la implantacin del vulo fertilizado. La hormona debilita o destruye los espermatozoides que ingresan al tero. Puede Geneticist, molecular durante 3-5 aos, segn el tipo de DIU que se [place]. MTODOS ANTICONCEPTIVOS PERMANENTES  Ligadura de trompas en la mujer: se realiza sellando, atando u obstruyendo quirrgicamente las trompas de Falopio lo que impide que el vulo descienda hacia el tero.  Esterilizacin histeroscpica: Implica la colocacin de un pequeo espiral o la insercin en cada trompa de Falopio. El mdico utiliza una tcnica llamada histeroscopa para Primary school teacher procedimiento. El dispositivo produce la formacin de tejido Designer, television/film set. Esto da como resultado una obstruccin permanente de las trompas de Falopio, de modo que la esperma no pueda fertilizar el vulo. Demora alrededor de 3 meses despus del procedimiento hasta que el conducto se obstruye. Tendr que usar otro mtodo anticonceptivo durante al menos 3 meses.  Esterilizacin masculina: se  realiza ligando los conductos por los que pasan los espermatozoides (vasectoma).Esto impide que el esperma ingrese a la vagina durante el acto sexual. Luego del procedimiento, el hombre puede eyacular lquido (semen). MTODOS DE PLANIFICACIN NATURAL  Planificacin familiar natural: consiste en no Management consultant o usar un mtodo de barrera (condn, [place], capuchn cervical) en los [employer] la mujer podra quedar [place].  Mtodo de calendario: consiste en el seguimiento de la duracin de cada ciclo menstrual y la identificacin de los perodos frtiles.  Mtodo de ovulacin: Paramedic las relaciones sexuales durante la ovulacin.  Mtodo sintotrmico: Advertising copywriter sexuales en la poca en la que se est ovulando, utilizando un termmetro y tendiendo en cuenta los sntomas de la ovulacin.  Mtodo postovulacin: Youth worker las relaciones sexuales para despus de haber ovulado. Independientemente del tipo o mtodo anticonceptivo que usted elija, es importante que use condones para protegerse contra las infecciones de transmisin sexual (ETS). Hable con su mdico con respecto a qu mtodo anticonceptivo es el ms apropiado para usted.   Esta informacin no tiene Theme park manager el consejo del mdico. Asegrese de hacerle al mdico cualquier pregunta que  tenga.   Document Released: 03/07/2005 Document Revised: 11/07/2012 Elsevier Interactive Patient Education [employer]

## 2015-06-29 LAB — CULTURE, OB URINE: Colony Count: >=100000

## 2015-07-24 ENCOUNTER — Encounter: Payer: Self-pay | Admitting: Family Medicine

## 2015-07-24 ENCOUNTER — Ambulatory Visit (INDEPENDENT_AMBULATORY_CARE_PROVIDER_SITE_OTHER): Payer: Self-pay | Admitting: Family Medicine

## 2015-07-24 VITALS — BP 109/78 | HR 93 | Wt 207.0 lb

## 2015-07-24 DIAGNOSIS — O4413 Placenta previa with hemorrhage, third trimester: Secondary | ICD-10-CM

## 2015-07-24 DIAGNOSIS — O09892 Supervision of other high risk pregnancies, second trimester: Secondary | ICD-10-CM

## 2015-07-24 DIAGNOSIS — O26813 Pregnancy related exhaustion and fatigue, third trimester: Secondary | ICD-10-CM

## 2015-07-24 DIAGNOSIS — K219 Gastro-esophageal reflux disease without esophagitis: Secondary | ICD-10-CM

## 2015-07-24 DIAGNOSIS — Z23 Encounter for immunization: Secondary | ICD-10-CM

## 2015-07-24 DIAGNOSIS — O4403 Placenta previa specified as without hemorrhage, third trimester: Secondary | ICD-10-CM

## 2015-07-24 DIAGNOSIS — Z36 Encounter for antenatal screening of mother: Secondary | ICD-10-CM

## 2015-07-24 LAB — CBC
HEMATOCRIT: 35.1 % (ref 35.0–45.0)
HEMOGLOBIN: 11.1 g/dL — AB (ref 11.7–15.5)
MCH: 26.9 pg — ABNORMAL LOW (ref 27.0–33.0)
MCHC: 31.6 g/dL — AB (ref 32.0–36.0)
MCV: 85 fL (ref 80.0–100.0)
MPV: 9.9 fL (ref 7.5–12.5)
Platelets: 267 10*3/uL (ref 140–400)
RBC: 4.13 MIL/uL (ref 3.80–5.10)
RDW: 15.6 % — ABNORMAL HIGH (ref 11.0–15.0)
WBC: 10.4 10*3/uL (ref 3.8–10.8)

## 2015-07-24 MED ORDER — RANITIDINE HCL 150 MG PO CAPS: 150.0000 mg | ORAL_CAPSULE | Freq: Two times a day (BID) | ORAL | Status: AC

## 2015-07-24 NOTE — Patient Instructions (Signed)
Second Trimester of Pregnancy The second trimester is from week 13 through week 28, months 4 through 6. The second trimester is often a time when you feel your best. Your body has also adjusted to being pregnant, and you begin to feel better physically. Usually, morning sickness has lessened or quit completely, you may have more energy, and you may have an increase in appetite. The second trimester is also a time when the fetus is growing rapidly. At the end of the sixth month, the fetus is about 9 inches long and weighs about 1 pounds. You will likely begin to feel the baby move (quickening) between 18 and 20 weeks of the pregnancy. BODY CHANGES Your body goes through many changes during pregnancy. The changes vary from woman to woman.   Your weight will continue to increase. You will notice your lower abdomen bulging out.  You may begin to get stretch marks on your hips, abdomen, and breasts.  You may develop headaches that can be relieved by medicines approved by your health care provider.  You may urinate more often because the fetus is pressing on your bladder.  You may develop or continue to have heartburn as a result of your pregnancy.  You may develop constipation because certain hormones are causing the muscles that push waste through your intestines to slow down.  You may develop hemorrhoids or swollen, bulging veins (varicose veins).  You may have back pain because of the weight gain and pregnancy hormones relaxing your joints between the bones in your pelvis and as a result of a shift in weight and the muscles that support your balance.  Your breasts will continue to grow and be tender.  Your gums may bleed and may be sensitive to brushing and flossing.  Dark spots or blotches (chloasma, mask of pregnancy) may develop on your face. This will likely fade after the baby is born.  A dark line from your belly button to the pubic area (linea nigra) may appear. This will likely  fade after the baby is born.  You may have changes in your hair. These can include thickening of your hair, rapid growth, and changes in texture. Some women also have hair loss during or after pregnancy, or hair that feels dry or thin. Your hair will most likely return to normal after your baby is born. WHAT TO EXPECT AT YOUR PRENATAL VISITS During a routine prenatal visit:  You will be weighed to make sure you and the fetus are growing normally.  Your blood pressure will be taken.  Your abdomen will be measured to track your baby's growth.  The fetal heartbeat will be listened to.  Any test results from the previous visit will be discussed. Your health care provider may ask you:  How you are feeling.  If you are feeling the baby move.  If you have had any abnormal symptoms, such as leaking fluid, bleeding, severe headaches, or abdominal cramping.  If you are using any tobacco products, including cigarettes, chewing tobacco, and electronic cigarettes.  If you have any questions. Other tests that may be performed during your second trimester include:  Blood tests that check for:  Low iron levels (anemia).  Gestational diabetes (between 24 and 28 weeks).  Rh antibodies.  Urine tests to check for infections, diabetes, or protein in the urine.  An ultrasound to confirm the proper growth and development of the baby.  An amniocentesis to check for possible genetic problems.  Fetal screens for spina bifida   and Down syndrome.  HIV (human immunodeficiency virus) testing. Routine prenatal testing includes screening for HIV, unless you choose not to have this test. HOME CARE INSTRUCTIONS   Avoid all smoking, herbs, alcohol, and unprescribed drugs. These chemicals affect the formation and growth of the baby.  Do not use any tobacco products, including cigarettes, chewing tobacco, and electronic cigarettes. If you need help quitting, ask your health care provider. You may receive  counseling support and other resources to help you quit.  Follow your health care provider's instructions regarding medicine use. There are medicines that are either safe or unsafe to take during pregnancy.  Exercise only as directed by your health care provider. Experiencing uterine cramps is a good sign to stop exercising.  Continue to eat regular, healthy meals.  Wear a good support bra for breast tenderness.  Do not use hot tubs, steam rooms, or saunas.  Wear your seat belt at all times when driving.  Avoid raw meat, uncooked cheese, cat litter boxes, and soil used by cats. These carry germs that can cause birth defects in the baby.  Take your prenatal vitamins.  Take 1500-2000 mg of calcium daily starting at the 20th week of pregnancy until you deliver your baby.  Try taking a stool softener (if your health care provider approves) if you develop constipation. Eat more high-fiber foods, such as fresh vegetables or fruit and whole grains. Drink plenty of fluids to keep your urine clear or pale yellow.  Take warm sitz baths to soothe any pain or discomfort caused by hemorrhoids. Use hemorrhoid cream if your health care provider approves.  If you develop varicose veins, wear support hose. Elevate your feet for 15 minutes, 3-4 times a day. Limit salt in your diet.  Avoid heavy lifting, wear low heel shoes, and practice good posture.  Rest with your legs elevated if you have leg cramps or low back pain.  Visit your dentist if you have not gone yet during your pregnancy. Use a soft toothbrush to brush your teeth and be gentle when you floss.  A sexual relationship may be continued unless your health care provider directs you otherwise.  Continue to go to all your prenatal visits as directed by your health care provider. SEEK MEDICAL CARE IF:   You have dizziness.  You have mild pelvic cramps, pelvic pressure, or nagging pain in the abdominal area.  You have persistent nausea,  vomiting, or diarrhea.  You have a bad smelling vaginal discharge.  You have pain with urination. SEEK IMMEDIATE MEDICAL CARE IF:   You have a fever.  You are leaking fluid from your vagina.  You have spotting or bleeding from your vagina.  You have severe abdominal cramping or pain.  You have rapid weight gain or loss.  You have shortness of breath with chest pain.  You notice sudden or extreme swelling of your face, hands, ankles, feet, or legs.  You have not felt your baby move in over an hour.  You have severe headaches that do not go away with medicine.  You have vision changes.   This information is not intended to replace advice given to you by your health care provider. Make sure you discuss any questions you have with your health care provider.   Document Released: 03/01/2001 Document Revised: 03/28/2014 Document Reviewed: 05/08/2012 Elsevier Interactive Patient Education 2016 [employer]   Breastfeeding Deciding to breastfeed is one of the best choices you can make for you and your baby. A change   in hormones during pregnancy causes your breast tissue to grow and increases the number and size of your milk ducts. These hormones also allow proteins, sugars, and fats from your blood supply to make breast milk in your milk-producing glands. Hormones prevent breast milk from being released before your baby is born as well as prompt milk flow after birth. Once breastfeeding has begun, thoughts of your baby, as well as his or her sucking or crying, can stimulate the release of milk from your milk-producing glands.  BENEFITS OF BREASTFEEDING For Your Baby  Your first milk (colostrum) helps your baby's digestive system function better.  There are antibodies in your milk that help your baby fight off infections.  Your baby has a lower incidence of asthma, allergies, and sudden infant death syndrome.  The nutrients in breast milk are better for your baby than infant  formulas and are designed uniquely for your baby's needs.  Breast milk improves your baby's brain development.  Your baby is less likely to develop other conditions, such as childhood obesity, asthma, or type 2 diabetes mellitus. For You  Breastfeeding helps to create a very special bond between you and your baby.  Breastfeeding is convenient. Breast milk is always available at the correct temperature and costs nothing.  Breastfeeding helps to burn calories and helps you lose the weight gained during pregnancy.  Breastfeeding makes your uterus contract to its prepregnancy size faster and slows bleeding (lochia) after you give birth.   Breastfeeding helps to lower your risk of developing type 2 diabetes mellitus, osteoporosis, and breast or ovarian cancer later in life. SIGNS THAT YOUR BABY IS HUNGRY Early Signs of Hunger  Increased alertness or activity.  Stretching.  Movement of the head from side to side.  Movement of the head and opening of the mouth when the corner of the mouth or cheek is stroked (rooting).  Increased sucking sounds, smacking lips, cooing, sighing, or squeaking.  Hand-to-mouth movements.  Increased sucking of fingers or hands. Late Signs of Hunger  Fussing.  Intermittent crying. Extreme Signs of Hunger Signs of extreme hunger will require calming and consoling before your baby will be able to breastfeed successfully. Do not wait for the following signs of extreme hunger to occur before you initiate breastfeeding:  Restlessness.  A loud, strong cry.  Screaming. BREASTFEEDING BASICS Breastfeeding Initiation  Find a comfortable place to sit or lie down, with your neck and back well supported.  Place a pillow or rolled up blanket under your baby to bring him or her to the level of your breast (if you are seated). Nursing pillows are specially designed to help support your arms and your baby while you breastfeed.  Make sure that your baby's  abdomen is facing your abdomen.  Gently massage your breast. With your fingertips, massage from your chest wall toward your nipple in a circular motion. This encourages milk flow. You may need to continue this action during the feeding if your milk flows slowly.  Support your breast with 4 fingers underneath and your thumb above your nipple. Make sure your fingers are well away from your nipple and your baby's mouth.  Stroke your baby's lips gently with your finger or nipple.  When your baby's mouth is open wide enough, quickly bring your baby to your breast, placing your entire nipple and as much of the colored area around your nipple (areola) as possible into your baby's mouth.  More areola should be visible above your baby's upper lip than   below the lower lip.  Your baby's tongue should be between his or her lower gum and your breast.  Ensure that your baby's mouth is correctly positioned around your nipple (latched). Your baby's lips should create a seal on your breast and be turned out (everted).  It is common for your baby to suck about 2-3 minutes in order to start the flow of breast milk. Latching Teaching your baby how to latch on to your breast properly is very important. An improper latch can cause nipple pain and decreased milk supply for you and poor weight gain in your baby. Also, if your baby is not latched onto your nipple properly, he or she may swallow some air during feeding. This can make your baby fussy. Burping your baby when you switch breasts during the feeding can help to get rid of the air. However, teaching your baby to latch on properly is still the best way to prevent fussiness from swallowing air while breastfeeding. Signs that your baby has successfully latched on to your nipple:  Silent tugging or silent sucking, without causing you pain.  Swallowing heard between every 3-4 sucks.  Muscle movement above and in front of his or her ears while sucking. Signs  that your baby has not successfully latched on to nipple:  Sucking sounds or smacking sounds from your baby while breastfeeding.  Nipple pain. If you think your baby has not latched on correctly, slip your finger into the corner of your baby's mouth to break the suction and place it between your baby's gums. Attempt breastfeeding initiation again. Signs of Successful Breastfeeding Signs from your baby:  A gradual decrease in the number of sucks or complete cessation of sucking.  Falling asleep.  Relaxation of his or her body.  Retention of a small amount of milk in his or her mouth.  Letting go of your breast by himself or herself. Signs from you:  Breasts that have increased in firmness, weight, and size 1-3 hours after feeding.  Breasts that are softer immediately after breastfeeding.  Increased milk volume, as well as a change in milk consistency and color by the fifth day of breastfeeding.  Nipples that are not sore, cracked, or bleeding. Signs That Your Baby is Getting Enough Milk  Wetting at least 3 diapers in a 24-hour period. The urine should be clear and pale yellow by age 5 days.  At least 3 stools in a 24-hour period by age 5 days. The stool should be soft and yellow.  At least 3 stools in a 24-hour period by age 7 days. The stool should be seedy and yellow.  No loss of weight greater than 10% of birth weight during the first 3 days of age.  Average weight gain of 4-7 ounces (113-198 g) per week after age 4 days.  Consistent daily weight gain by age 5 days, without weight loss after the age of 2 weeks. After a feeding, your baby may spit up a small amount. This is common. BREASTFEEDING FREQUENCY AND DURATION Frequent feeding will help you make more milk and can prevent sore nipples and breast engorgement. Breastfeed when you feel the need to reduce the fullness of your breasts or when your baby shows signs of hunger. This is called "breastfeeding on demand." Avoid  introducing a pacifier to your baby while you are working to establish breastfeeding (the first 4-6 weeks after your baby is born). After this time you may choose to use a pacifier. Research has shown that   pacifier use during the first year of a baby's life decreases the risk of sudden infant death syndrome (SIDS). Allow your baby to feed on each breast as long as he or she wants. Breastfeed until your baby is finished feeding. When your baby unlatches or falls asleep while feeding from the first breast, offer the second breast. Because newborns are often sleepy in the first few weeks of life, you may need to awaken your baby to get him or her to feed. Breastfeeding times will vary from baby to baby. However, the following rules can serve as a guide to help you ensure that your baby is properly fed:  Newborns (babies 4 weeks of age or younger) may breastfeed every 1-3 hours.  Newborns should not go longer than 3 hours during the day or 5 hours during the night without breastfeeding.  You should breastfeed your baby a minimum of 8 times in a 24-hour period until you begin to introduce solid foods to your baby at around 6 months of age. BREAST MILK PUMPING Pumping and storing breast milk allows you to ensure that your baby is exclusively fed your breast milk, even at times when you are unable to breastfeed. This is especially important if you are going back to work while you are still breastfeeding or when you are not able to be present during feedings. Your lactation consultant can give you guidelines on how long it is safe to store breast milk. A breast pump is a machine that allows you to pump milk from your breast into a sterile bottle. The pumped breast milk can then be stored in a refrigerator or freezer. Some breast pumps are operated by hand, while others use electricity. Ask your lactation consultant which type will work best for you. Breast pumps can be purchased, but some hospitals and  breastfeeding support groups lease breast pumps on a monthly basis. A lactation consultant can teach you how to hand express breast milk, if you prefer not to use a pump. CARING FOR YOUR BREASTS WHILE YOU BREASTFEED Nipples can become dry, cracked, and sore while breastfeeding. The following recommendations can help keep your breasts moisturized and healthy:  Avoid using soap on your nipples.  Wear a supportive bra. Although not required, special nursing bras and tank tops are designed to allow access to your breasts for breastfeeding without taking off your entire bra or top. Avoid wearing underwire-style bras or extremely tight bras.  Air dry your nipples for 3-4minutes after each feeding.  Use only cotton bra pads to absorb leaked breast milk. Leaking of breast milk between feedings is normal.  Use lanolin on your nipples after breastfeeding. Lanolin helps to maintain your skin's normal moisture barrier. If you use pure lanolin, you do not need to wash it off before feeding your baby again. Pure lanolin is not toxic to your baby. You may also hand express a few drops of breast milk and gently massage that milk into your nipples and allow the milk to air dry. In the first few weeks after giving birth, some women experience extremely full breasts (engorgement). Engorgement can make your breasts feel heavy, warm, and tender to the touch. Engorgement peaks within 3-5 days after you give birth. The following recommendations can help ease engorgement:  Completely empty your breasts while breastfeeding or pumping. You may want to start by applying warm, moist heat (in the shower or with warm water-soaked hand towels) just before feeding or pumping. This increases circulation and helps the milk   flow. If your baby does not completely empty your breasts while breastfeeding, pump any extra milk after he or she is finished.  Wear a snug bra (nursing or regular) or tank top for 1-2 days to signal your body  to slightly decrease milk production.  Apply ice packs to your breasts, unless this is too uncomfortable for you.  Make sure that your baby is latched on and positioned properly while breastfeeding. If engorgement persists after 48 hours of following these recommendations, contact your health care provider or a lactation consultant. OVERALL HEALTH CARE RECOMMENDATIONS WHILE BREASTFEEDING  Eat healthy foods. Alternate between meals and snacks, eating 3 of each per day. Because what you eat affects your breast milk, some of the foods may make your baby more irritable than usual. Avoid eating these foods if you are sure that they are negatively affecting your baby.  Drink milk, fruit juice, and water to satisfy your thirst (about 10 glasses a day).  Rest often, relax, and continue to take your prenatal vitamins to prevent fatigue, stress, and anemia.  Continue breast self-awareness checks.  Avoid chewing and smoking tobacco. Chemicals from cigarettes that pass into breast milk and exposure to secondhand smoke may harm your baby.  Avoid alcohol and drug use, including marijuana. Some medicines that may be harmful to your baby can pass through breast milk. It is important to ask your health care provider before taking any medicine, including all over-the-counter and prescription medicine as well as vitamin and herbal supplements. It is possible to become pregnant while breastfeeding. If birth control is desired, ask your health care provider about options that will be safe for your baby. SEEK MEDICAL CARE IF:  You feel like you want to stop breastfeeding or have become frustrated with breastfeeding.  You have painful breasts or nipples.  Your nipples are cracked or bleeding.  Your breasts are red, tender, or warm.  You have a swollen area on either breast.  You have a fever or chills.  You have nausea or vomiting.  You have drainage other than breast milk from your nipples.  Your  breasts do not become full before feedings by the fifth day after you give birth.  You feel sad and depressed.  Your baby is too sleepy to eat well.  Your baby is having trouble sleeping.   Your baby is wetting less than 3 diapers in a 24-hour period.  Your baby has less than 3 stools in a 24-hour period.  Your baby's skin or the white part of his or her eyes becomes yellow.   Your baby is not gaining weight by 5 days of age. SEEK IMMEDIATE MEDICAL CARE IF:  Your baby is overly tired (lethargic) and does not want to wake up and feed.  Your baby develops an unexplained fever.   This information is not intended to replace advice given to you by your health care provider. Make sure you discuss any questions you have with your health care provider.   Document Released: 03/07/2005 Document Revised: 11/26/2014 Document Reviewed: 08/29/2012 Elsevier Interactive Patient Education 2016 [employer]  

## 2015-07-24 NOTE — Progress Notes (Signed)
Subjective:  Whitney FreezeJohanna Petty is a 35 y.o. G3P1011 at 4178w5d being seen today for ongoing prenatal care.  She is currently monitored for the following issues for this low-risk pregnancy and has Supervision of other high risk pregnancy, antepartum; AMA (advanced maternal age) multigravida 35+; History of gestational hypertension; Obesity affecting pregnancy, antepartum; Obesity (BMI 35.0-39.9 without comorbidity) (HCC); Depression affecting pregnancy, antepartum; and Placenta previa antepartum on her problem list.  Patient reports no complaints.  Contractions: Not present. Vag. Bleeding: None.  Movement: Present. Denies leaking of fluid.   The following portions of the patient's history were reviewed and updated as appropriate: allergies, current medications, past family history, past medical history, past social history, past surgical history and problem list. Problem list updated.  Objective:   Filed Vitals:   07/24/15 0900  BP: 109/78  Pulse: 93  Weight: 207 lb (93.895 kg)    Fetal Status: Fetal Heart Rate (bpm): 147 Fundal Height: 28 cm Movement: Present     General:  Alert, oriented and cooperative. Patient is in no acute distress.  Skin: Skin is warm and dry. No rash noted.   Cardiovascular: Normal heart rate noted  Respiratory: Normal respiratory effort, no problems with respiration noted  Abdomen: Soft, gravid, appropriate for gestational age. Pain/Pressure: Present     Pelvic: Vag. Bleeding: None Vag D/C Character: Thin   Cervical exam deferred        Extremities: Normal range of motion.  Edema: None  Mental Status: Normal mood and affect. Normal behavior. Normal judgment and thought content.     Assessment and Plan:  Pregnancy: G3P1011 at 6678w5d  1. Supervision of other high risk pregnancy, antepartum, second trimester Continue routine prenatal care. 28 wk labs today - Tdap vaccine greater than or equal to 7yo IM - CBC - HIV antibody - Glucose Tolerance, 1 HR (50g) -  RPR  2. Placenta previa antepartum, third trimester For u/s f/u in 1 wk  3. Pregnancy related fatigue in third trimester Check TSH - TSH  4. Gastroesophageal reflux disease without esophagitis Trial of Zantac - ranitidine (ZANTAC) 150 MG capsule; Take 1 capsule (150 mg total) by mouth 2 (two) times daily.  Dispense: 60 capsule; Refill: 3  Preterm labor symptoms and general obstetric precautions including but not limited to vaginal bleeding, contractions, leaking of fluid and fetal movement were reviewed in detail with the patient. Please refer to After Visit Summary for other counseling recommendations.  Return in 2 weeks (on 08/07/2015).   Reva Boresanya S Pratt, MD

## 2015-07-25 LAB — HIV ANTIBODY (ROUTINE TESTING W REFLEX): HIV 1&2 Ab, 4th Generation: NONREACTIVE

## 2015-07-25 LAB — GLUCOSE TOLERANCE, 1 HOUR (50G) W/O FASTING: GLUCOSE, 1 HR, GESTATIONAL: 162 mg/dL — AB (ref <140)

## 2015-07-25 LAB — TSH: TSH: 0.97 m[IU]/L

## 2015-07-27 ENCOUNTER — Telehealth: Payer: Self-pay | Admitting: *Deleted

## 2015-07-27 LAB — RPR

## 2015-07-27 NOTE — Telephone Encounter (Signed)
-----   Message from Reva Boresanya S Pratt, MD sent at 07/27/2015 11:55 AM EDT ----- Needs 3 hour

## 2015-07-27 NOTE — Telephone Encounter (Signed)
Called pt, no answer, left message to call the office.  

## 2015-07-29 ENCOUNTER — Telehealth: Payer: Self-pay | Admitting: *Deleted

## 2015-07-29 NOTE — Telephone Encounter (Signed)
Called pt, no answer, left message via interpreter line.

## 2015-07-29 NOTE — Telephone Encounter (Signed)
-----   Message from Tanya S Pratt, MD sent at 07/27/2015 11:55 AM EDT ----- Needs 3 hour 

## 2015-07-31 ENCOUNTER — Ambulatory Visit (HOSPITAL_COMMUNITY)
Admission: RE | Admit: 2015-07-31 | Discharge: 2015-07-31 | Disposition: A | Discharge status: Home / Self Care | Payer: Self-pay | Source: Ambulatory Visit | Attending: Obstetrics & Gynecology | Admitting: Obstetrics & Gynecology

## 2015-07-31 ENCOUNTER — Other Ambulatory Visit (HOSPITAL_COMMUNITY): Payer: Self-pay | Admitting: Maternal and Fetal Medicine

## 2015-07-31 ENCOUNTER — Encounter (HOSPITAL_COMMUNITY): Payer: Self-pay

## 2015-07-31 VITALS — BP 101/67 | HR 93 | Wt 208.6 lb

## 2015-07-31 DIAGNOSIS — O09523 Supervision of elderly multigravida, third trimester: Secondary | ICD-10-CM | POA: Insufficient documentation

## 2015-07-31 DIAGNOSIS — O99212 Obesity complicating pregnancy, second trimester: Secondary | ICD-10-CM

## 2015-07-31 DIAGNOSIS — O4400 Placenta previa specified as without hemorrhage, unspecified trimester: Secondary | ICD-10-CM

## 2015-07-31 DIAGNOSIS — Z3A29 29 weeks gestation of pregnancy: Secondary | ICD-10-CM | POA: Insufficient documentation

## 2015-07-31 DIAGNOSIS — O09522 Supervision of elderly multigravida, second trimester: Secondary | ICD-10-CM

## 2015-07-31 DIAGNOSIS — O99213 Obesity complicating pregnancy, third trimester: Secondary | ICD-10-CM | POA: Insufficient documentation

## 2015-07-31 DIAGNOSIS — O09293 Supervision of pregnancy with other poor reproductive or obstetric history, third trimester: Secondary | ICD-10-CM | POA: Insufficient documentation

## 2015-07-31 DIAGNOSIS — O09292 Supervision of pregnancy with other poor reproductive or obstetric history, second trimester: Secondary | ICD-10-CM

## 2015-07-31 DIAGNOSIS — O4403 Placenta previa specified as without hemorrhage, third trimester: Secondary | ICD-10-CM | POA: Insufficient documentation

## 2015-07-31 DIAGNOSIS — O09529 Supervision of elderly multigravida, unspecified trimester: Secondary | ICD-10-CM

## 2015-08-07 ENCOUNTER — Ambulatory Visit (INDEPENDENT_AMBULATORY_CARE_PROVIDER_SITE_OTHER): Payer: Self-pay | Admitting: Obstetrics & Gynecology

## 2015-08-07 VITALS — BP 108/73 | HR 94 | Wt 208.0 lb

## 2015-08-07 DIAGNOSIS — O09523 Supervision of elderly multigravida, third trimester: Secondary | ICD-10-CM

## 2015-08-07 DIAGNOSIS — E669 Obesity, unspecified: Secondary | ICD-10-CM

## 2015-08-07 DIAGNOSIS — O4413 Placenta previa with hemorrhage, third trimester: Secondary | ICD-10-CM

## 2015-08-07 DIAGNOSIS — O4403 Placenta previa specified as without hemorrhage, third trimester: Secondary | ICD-10-CM

## 2015-08-07 DIAGNOSIS — O99213 Obesity complicating pregnancy, third trimester: Secondary | ICD-10-CM

## 2015-08-07 DIAGNOSIS — O09893 Supervision of other high risk pregnancies, third trimester: Secondary | ICD-10-CM

## 2015-08-07 NOTE — Progress Notes (Signed)
Subjective:  Whitney FreezeJohanna Petty is a 35 y.o. H G3P1011 at 6367w5d being seen today for ongoing prenatal care.  She is currently monitored for the following issues for this high-risk pregnancy and has Supervision of other high risk pregnancy, antepartum; AMA (advanced maternal age) multigravida 35+; History of gestational hypertension; Obesity affecting pregnancy, antepartum; Obesity (BMI 35.0-39.9 without comorbidity) (HCC); Depression affecting pregnancy, antepartum; and Placenta previa antepartum on her problem list.  Patient reports no complaints.  Contractions: Irregular. Vag. Bleeding: None.  Movement: Present. Denies leaking of fluid.   The following portions of the patient's history were reviewed and updated as appropriate: allergies, current medications, past family history, past medical history, past social history, past surgical history and problem list. Problem list updated.  Objective:   Filed Vitals:   08/07/15 0919  BP: 108/73  Pulse: 94  Weight: 208 lb (94.348 kg)    Fetal Status: Fetal Heart Rate (bpm): 141   Movement: Present     General:  Alert, oriented and cooperative. Patient is in no acute distress.  Skin: Skin is warm and dry. No rash noted.   Cardiovascular: Normal heart rate noted  Respiratory: Normal respiratory effort, no problems with respiration noted  Abdomen: Soft, gravid, appropriate for gestational age. Pain/Pressure: Present     Pelvic: Vag. Bleeding: None Vag D/C Character: Thin   Cervical exam deferred        Extremities: Normal range of motion.  Edema: None  Mental Status: Normal mood and affect. Normal behavior. Normal judgment and thought content.   Urinalysis:      Assessment and Plan:  Pregnancy: G3P1011 at 1167w5d  1. AMA (advanced maternal age) multigravida 35+, third trimester   2. Obesity (BMI 35.0-39.9 without comorbidity) (HCC)   3. Obesity affecting pregnancy, antepartum, third trimester   4. Supervision of other high risk pregnancy,  antepartum, third trimester   5. Placenta previa antepartum, third trimester - repeat u/s scheduled - previa precautions, NPV  Preterm labor symptoms and general obstetric precautions including but not limited to vaginal bleeding, contractions, leaking of fluid and fetal movement were reviewed in detail with the patient. Please refer to After Visit Summary for other counseling recommendations.  Return in about 2 weeks (around 08/21/2015).   Allie BossierMyra C Keyion Knack, MD

## 2015-08-20 ENCOUNTER — Ambulatory Visit (INDEPENDENT_AMBULATORY_CARE_PROVIDER_SITE_OTHER): Payer: Self-pay | Admitting: Advanced Practice Midwife

## 2015-08-20 VITALS — BP 116/78 | HR 109 | Wt 210.0 lb

## 2015-08-20 DIAGNOSIS — O09523 Supervision of elderly multigravida, third trimester: Secondary | ICD-10-CM

## 2015-08-20 DIAGNOSIS — O99810 Abnormal glucose complicating pregnancy: Secondary | ICD-10-CM

## 2015-08-20 DIAGNOSIS — O09893 Supervision of other high risk pregnancies, third trimester: Secondary | ICD-10-CM

## 2015-08-20 NOTE — Progress Notes (Signed)
Subjective:  Whitney FreezeJohanna Petty is a 35 y.o. G3P1011 at 3666w4d being seen today for ongoing prenatal care.  She is currently monitored for the following issues for this high-risk pregnancy and has Supervision of other high risk pregnancy, antepartum; AMA (advanced maternal age) multigravida 35+; History of gestational hypertension; Obesity affecting pregnancy, antepartum; Obesity (BMI 35.0-39.9 without comorbidity) (HCC); Depression affecting pregnancy, antepartum; Placenta previa antepartum; and Abnormal maternal glucose tolerance, antepartum on her problem list.  Patient reports no complaints.  Contractions: Irregular. Vag. Bleeding: None.  Movement: Present. Denies leaking of fluid.   The following portions of the patient's history were reviewed and updated as appropriate: allergies, current medications, past family history, past medical history, past social history, past surgical history and problem list. Problem list updated.  Objective:   Filed Vitals:   08/20/15 1524  BP: 116/78  Pulse: 109  Weight: 210 lb (95.255 kg)    Fetal Status: Fetal Heart Rate (bpm): 147 Fundal Height: 32 cm Movement: Present     General:  Alert, oriented and cooperative. Patient is in no acute distress.  Skin: Skin is warm and dry. No rash noted.   Cardiovascular: Normal heart rate noted  Respiratory: Normal respiratory effort, no problems with respiration noted  Abdomen: Soft, gravid, appropriate for gestational age. Pain/Pressure: Present     Pelvic: Vag. Bleeding: None Vag D/C Character: Thin   Cervical exam deferred        Extremities: Normal range of motion.  Edema: None  Mental Status: Normal mood and affect. Normal behavior. Normal judgment and thought content.   Urinalysis:      Assessment and Plan:  Pregnancy: G3P1011 at 4166w4d  1. AMA (advanced maternal age) multigravida 35+, third trimester   2. Supervision of other high risk pregnancy, antepartum, third trimester   3. Abnormal maternal  glucose tolerance, antepartum - 3 hour GTT scheduled 08/24/15  Preterm labor symptoms and general obstetric precautions including but not limited to vaginal bleeding, contractions, leaking of fluid and fetal movement were reviewed in detail with the patient. Please refer to After Visit Summary for other counseling recommendations.  Return in about 2 weeks (around 09/03/2015).   Dorathy KinsmanVirginia Zhuri Krass, CNM

## 2015-08-20 NOTE — Patient Instructions (Addendum)
  Prueba de tolerancia a la glucosa durante el embarazo (Glucose Tolerance Test During Pregnancy) La prueba de tolerancia a la glucosa es un anlisis de sangre que se usa para determinar si ha contrado un tipo de diabetes durante el embarazo (diabetes gestacional). Esto es cuando el cuerpo no procesa de forma correcta el azcar (glucosa) en los alimentos que come, lo cual provoca niveles sanguneos altos de glucosa. Por lo general, la PTG se realiza despus de haberse hecho una prueba de glucosa de 1 hora, cuyos resultados indican que posiblemente tiene diabetes gestacional. Tambin se puede hacer si:  Tiene antecedentes de haber parido bebs muy grandes o antecedentes de muerte fetal repetida (beb nacido muerto).  Tiene signos o sntomas de diabetes, tales como:  Cambios en la visin.  Hormigueo o adormecimiento en las manos o los pies.  Cambios en el hambre, la sed y la miccin que no se explican por el embarazo. La PTG dura unas 3 horas. Le darn para beber una solucin de agua y azcar al principio de la prueba. Le extraern sangre antes de que beba la solucin, y 1, 2 y 3 horas despus de beberla. No se le permitir comer ni beber nada ms durante la prueba. Debe permanecer en el lugar en que se realiza la prueba para asegurarse de que la sangre se extraiga puntualmente. Tambin debe evitar realizar ejercicios durante la prueba porque esto puede alterar los resultados. PREPARACIN PARA EL ESTUDIO  Coma normalmente durante 3 das antes de la PTG e incluya muchos alimentos ricos en carbohidratos. No coma ni beba nada, excepto agua, durante las ltimas 12 horas antes de la prueba. Adems, el mdico puede pedirle que deje de tomar ciertos medicamentos antes de la prueba. RESULTADOS  Es su responsabilidad retirar el resultado del estudio. Consulte en el laboratorio o en el departamento en el que fue realizado el estudio cundo y cmo podr obtener los resultados. Comunquese con el mdico si tiene  preguntas sobre los resultados.  Rango de valores normales Los rangos para los valores normales pueden variar entre diferentes laboratorios y hospitales. Siempre debe consultar a su mdico despus de realizarse un anlisis u otros estudios para saber si los valores de sus resultados se consideran dentro de los lmites normales. Los niveles normales de glucemia son los siguientes:  Ayuno: menos de 105mg/dl.  Una hora despus de beber la solucin: menos de 190mg/dl.  Dos horas despus de beber la solucin: menos de 165mg/dl.  Tres horas despus de beber la solucin: menos de 145mg/dl. Algunas sustancias pueden alterar los resultados de la PTG. Estas pueden incluir lo siguiente:  Medicamentos para la presin arterial y la insuficiencia cardaca, como betabloqueantes, furosemida y tiacidas.  Antiinflamatorios, como aspirina.  Nicotina.  Algunos medicamentos psiquitricos. Significado de los resultados que estn fuera de los rangos de los valores normales Los resultados de la PTG por debajo de los valores normales pueden indicar varios problemas de salud, tales como:   Diabetes gestacional.  Respuesta al estrs agudo.  Sndrome de [place].  Tumores como feocromocitoma o glucagonoma.  Problemas renales de larga duracin.  Pancreatitis.  Hipertiroidismo.  Infeccin actual. Hable con el mdico sobre los resultados. El mdico utilizar los resultados para realizar un diagnstico y determinar un plan de tratamiento adecuado para usted.   Esta informacin no tiene como fin reemplazar el consejo del mdico. Asegrese de hacerle al mdico cualquier pregunta que tenga.   Document Released: 09/06/2011 Document Revised: 03/28/2014 Elsevier Interactive Patient Education 2016 [employer] Inc.  

## 2015-08-24 ENCOUNTER — Other Ambulatory Visit (INDEPENDENT_AMBULATORY_CARE_PROVIDER_SITE_OTHER): Payer: Self-pay | Admitting: *Deleted

## 2015-08-24 DIAGNOSIS — R7302 Impaired glucose tolerance (oral): Secondary | ICD-10-CM

## 2015-08-24 DIAGNOSIS — R7309 Other abnormal glucose: Secondary | ICD-10-CM

## 2015-08-25 LAB — GLUCOSE TOLERANCE, 3 HOURS
GLUCOSE, 1 HOUR-GESTATIONAL: 175 mg/dL (ref <190)
GLUCOSE, FASTING-GESTATIONAL: 69 mg/dL (ref 65–104)
Glucose Tolerance, 2 hour: 149 mg/dL (ref <165)
Glucose, GTT - 3 Hour: 92 mg/dL (ref <145)

## 2015-09-02 ENCOUNTER — Ambulatory Visit (INDEPENDENT_AMBULATORY_CARE_PROVIDER_SITE_OTHER): Payer: Self-pay | Admitting: Obstetrics and Gynecology

## 2015-09-02 VITALS — BP 114/76 | HR 99 | Wt 208.4 lb

## 2015-09-02 DIAGNOSIS — O99810 Abnormal glucose complicating pregnancy: Secondary | ICD-10-CM

## 2015-09-02 DIAGNOSIS — O09523 Supervision of elderly multigravida, third trimester: Secondary | ICD-10-CM

## 2015-09-02 DIAGNOSIS — O4403 Placenta previa specified as without hemorrhage, third trimester: Secondary | ICD-10-CM

## 2015-09-02 DIAGNOSIS — E669 Obesity, unspecified: Secondary | ICD-10-CM

## 2015-09-02 DIAGNOSIS — Z8759 Personal history of other complications of pregnancy, childbirth and the puerperium: Secondary | ICD-10-CM

## 2015-09-02 DIAGNOSIS — O4413 Placenta previa with hemorrhage, third trimester: Secondary | ICD-10-CM

## 2015-09-02 DIAGNOSIS — O09893 Supervision of other high risk pregnancies, third trimester: Secondary | ICD-10-CM

## 2015-09-02 NOTE — Progress Notes (Signed)
Subjective:  Whitney Petty is a 35 y.o. G3P1011 at 468w3d being seen today for ongoing prenatal care.  She is currently monitored for the following issues for this high-risk pregnancy and has Supervision of other high risk pregnancy, antepartum; AMA (advanced maternal age) multigravida 35+; History of gestational hypertension; Obesity affecting pregnancy, antepartum; Obesity (BMI 35.0-39.9 without comorbidity) (HCC); Depression affecting pregnancy, antepartum; Placenta previa antepartum; and Abnormal maternal glucose tolerance, antepartum on her problem list.  Patient reports no complaints.   Contractions: Irregular. Vag. Bleeding: None.  Movement: Present. Denies leaking of fluid.   The following portions of the patient's history were reviewed and updated as appropriate: allergies, current medications, past family history, past medical history, past social history, past surgical history and problem list. Problem list updated.  Objective:   Filed Vitals:   09/02/15 1005  BP: 114/76  Pulse: 99  Weight: 208 lb 6.4 oz (94.53 kg)    Fetal Status: Fetal Heart Rate (bpm): 131   Movement: Present     General:  Alert, oriented and cooperative. Patient is in no acute distress.  Skin: Skin is warm and dry. No rash noted.   Cardiovascular: Normal heart rate noted  Respiratory: Normal respiratory effort, no problems with respiration noted  Abdomen: Soft, gravid, appropriate for gestational age. Pain/Pressure: Present     Pelvic:  Cervical exam deferred        Extremities: Normal range of motion.  Edema: None  Mental Status: Normal mood and affect. Normal behavior. Normal judgment and thought content.   Urinalysis:      Assessment and Plan:  Pregnancy: G3P1011 at 6668w3d  1. Abnormal maternal glucose tolerance, antepartum S/p normal 3hr  2. AMA (advanced maternal age) multigravida 35+, third trimester No issues  3. History of gestational hypertension Continue baby ASA  4. Obesity (BMI  35.0-39.9 without comorbidity) (HCC) No issues  5. Placenta previa antepartum, third trimester Has follow up scan on 6/23. No VB or spotting. ER precautions given  6. Supervision of other high risk pregnancy, antepartum, third trimester See above. Gbs, gc/ct next visit  Preterm labor symptoms and general obstetric precautions including but not limited to vaginal bleeding, contractions, leaking of fluid and fetal movement were reviewed in detail with the patient. Please refer to After Visit Summary for other counseling recommendations.  Return in about 2 weeks (around 09/16/2015).   [place] Bingharlie Azyriah Nevins, MD

## 2015-09-11 ENCOUNTER — Ambulatory Visit (HOSPITAL_COMMUNITY)
Admission: RE | Admit: 2015-09-11 | Discharge: 2015-09-11 | Disposition: A | Discharge status: Home / Self Care | Payer: Self-pay | Source: Ambulatory Visit | Attending: Obstetrics & Gynecology | Admitting: Obstetrics & Gynecology

## 2015-09-11 ENCOUNTER — Other Ambulatory Visit (HOSPITAL_COMMUNITY): Payer: Self-pay | Admitting: Maternal and Fetal Medicine

## 2015-09-11 ENCOUNTER — Encounter (HOSPITAL_COMMUNITY): Payer: Self-pay

## 2015-09-11 DIAGNOSIS — Z8759 Personal history of other complications of pregnancy, childbirth and the puerperium: Secondary | ICD-10-CM

## 2015-09-11 DIAGNOSIS — F329 Major depressive disorder, single episode, unspecified: Secondary | ICD-10-CM

## 2015-09-11 DIAGNOSIS — O09529 Supervision of elderly multigravida, unspecified trimester: Secondary | ICD-10-CM

## 2015-09-11 DIAGNOSIS — O09523 Supervision of elderly multigravida, third trimester: Secondary | ICD-10-CM | POA: Insufficient documentation

## 2015-09-11 DIAGNOSIS — Z3A35 35 weeks gestation of pregnancy: Secondary | ICD-10-CM | POA: Insufficient documentation

## 2015-09-11 DIAGNOSIS — F32A Depression, unspecified: Secondary | ICD-10-CM

## 2015-09-11 DIAGNOSIS — O09893 Supervision of other high risk pregnancies, third trimester: Secondary | ICD-10-CM

## 2015-09-11 DIAGNOSIS — O99213 Obesity complicating pregnancy, third trimester: Secondary | ICD-10-CM

## 2015-09-11 DIAGNOSIS — O99340 Other mental disorders complicating pregnancy, unspecified trimester: Secondary | ICD-10-CM

## 2015-09-11 DIAGNOSIS — O99810 Abnormal glucose complicating pregnancy: Secondary | ICD-10-CM

## 2015-09-11 DIAGNOSIS — O4403 Placenta previa specified as without hemorrhage, third trimester: Secondary | ICD-10-CM

## 2015-09-11 DIAGNOSIS — O4413 Placenta previa with hemorrhage, third trimester: Secondary | ICD-10-CM | POA: Insufficient documentation

## 2015-09-13 ENCOUNTER — Inpatient Hospital Stay (HOSPITAL_COMMUNITY)
Admission: AD | Admit: 2015-09-13 | Discharge: 2015-09-13 | Disposition: A | Discharge status: Home / Self Care | Payer: Self-pay | Source: Ambulatory Visit | Attending: Obstetrics & Gynecology | Admitting: Obstetrics & Gynecology

## 2015-09-13 ENCOUNTER — Encounter (HOSPITAL_COMMUNITY): Payer: Self-pay | Admitting: *Deleted

## 2015-09-13 DIAGNOSIS — Z3A36 36 weeks gestation of pregnancy: Secondary | ICD-10-CM | POA: Insufficient documentation

## 2015-09-13 DIAGNOSIS — O99810 Abnormal glucose complicating pregnancy: Secondary | ICD-10-CM

## 2015-09-13 DIAGNOSIS — O99213 Obesity complicating pregnancy, third trimester: Secondary | ICD-10-CM | POA: Insufficient documentation

## 2015-09-13 DIAGNOSIS — O09523 Supervision of elderly multigravida, third trimester: Secondary | ICD-10-CM

## 2015-09-13 DIAGNOSIS — O99343 Other mental disorders complicating pregnancy, third trimester: Secondary | ICD-10-CM | POA: Insufficient documentation

## 2015-09-13 DIAGNOSIS — F32A Depression, unspecified: Secondary | ICD-10-CM

## 2015-09-13 DIAGNOSIS — O09893 Supervision of other high risk pregnancies, third trimester: Secondary | ICD-10-CM

## 2015-09-13 DIAGNOSIS — F329 Major depressive disorder, single episode, unspecified: Secondary | ICD-10-CM | POA: Insufficient documentation

## 2015-09-13 DIAGNOSIS — O468X3 Other antepartum hemorrhage, third trimester: Secondary | ICD-10-CM | POA: Insufficient documentation

## 2015-09-13 DIAGNOSIS — Z8759 Personal history of other complications of pregnancy, childbirth and the puerperium: Secondary | ICD-10-CM

## 2015-09-13 DIAGNOSIS — O99340 Other mental disorders complicating pregnancy, unspecified trimester: Secondary | ICD-10-CM

## 2015-09-13 DIAGNOSIS — N930 Postcoital and contact bleeding: Secondary | ICD-10-CM

## 2015-09-13 NOTE — MAU Note (Signed)
Woke up this morning with blood in underwear, no pain

## 2015-09-13 NOTE — MAU Provider Note (Signed)
History   G3P1011 @ 36.0 wks in with vaginal bleeding scant amt vag bleeding after intercourse last night. Now has scant to small amt dark vag bleeding. Denies any pain or ROM.  CSN: [id number]  Arrival date & time 09/13/15  [id number]   First Provider Initiated Contact with Patient 09/13/15 0920      Chief Complaint  Patient presents with  . Vaginal Bleeding    HPI  No past medical history on file.  History reviewed. No pertinent past surgical history.  Family History  Problem Relation Age of Onset  . Diabetes Mother     Social History  Substance Use Topics  . Smoking status: Never Smoker   . Smokeless tobacco: Never Used  . Alcohol Use: No    OB History    Gravida Para Term Preterm AB TAB SAB Ectopic Multiple Living   3 1 1  1  1   1       Review of Systems  Constitutional: Negative.   HENT: Negative.   Eyes: Negative.   Respiratory: Negative.   Cardiovascular: Negative.   Gastrointestinal: Negative.   Endocrine: Negative.   Genitourinary: Positive for vaginal bleeding.  Musculoskeletal: Negative.   Skin: Negative.   Allergic/Immunologic: Negative.   Neurological: Negative.   Hematological: Negative.   Psychiatric/Behavioral: Negative.     Allergies  Review of patient's allergies indicates no known allergies.  Home Medications  No current outpatient prescriptions on file.  BP 97/56 mmHg  Pulse 87  Temp(Src) 97.7 F (36.5 C) (Oral)  Resp 17  LMP 01/04/2015 (Exact Date)  Physical Exam  Constitutional: She is oriented to person, place, and time. She appears well-developed and well-nourished.  HENT:  Head: Normocephalic.  Eyes: Pupils are equal, round, and reactive to light.  Neck: Normal range of motion.  Cardiovascular: Normal rate, regular rhythm, normal heart sounds and intact distal pulses.   Pulmonary/Chest: Effort normal and breath sounds normal.  Abdominal: Soft. Bowel sounds are normal.  Genitourinary: Vagina normal.  Musculoskeletal:  Normal range of motion.  Neurological: She is alert and oriented to person, place, and time. She has normal reflexes.  Skin: Skin is warm and dry.  Psychiatric: She has a normal mood and affect. Her behavior is normal. Judgment and thought content normal.    MAU Course  Procedures (including critical care time)  Labs Reviewed - No data to display No results found.   1. Abnormal maternal glucose tolerance, antepartum   2. Supervision of other high risk pregnancy, antepartum, third trimester   3. AMA (advanced maternal age) multigravida 35+, third trimester   4. History of gestational hypertension   5. Obesity affecting pregnancy, antepartum, third trimester   6. Depression affecting pregnancy, antepartum   7. PCB (post coital bleeding)       MDM  SVE cl/th/post/high. Scant amt dark vag bleeding on exam glove. FHR pattern reassurring. No contractions. POC discussed with Dr. Burnice LoganHarrawayKatrinka Blazing- Smith, pt ok for d/c home.

## 2015-09-13 NOTE — MAU Note (Signed)
Urine in lab 

## 2015-09-17 ENCOUNTER — Ambulatory Visit (INDEPENDENT_AMBULATORY_CARE_PROVIDER_SITE_OTHER): Payer: Self-pay | Admitting: Obstetrics and Gynecology

## 2015-09-17 VITALS — BP 114/77 | HR 91 | Wt 211.0 lb

## 2015-09-17 DIAGNOSIS — O09893 Supervision of other high risk pregnancies, third trimester: Secondary | ICD-10-CM

## 2015-09-17 DIAGNOSIS — F32A Depression, unspecified: Secondary | ICD-10-CM

## 2015-09-17 DIAGNOSIS — Z113 Encounter for screening for infections with a predominantly sexual mode of transmission: Secondary | ICD-10-CM

## 2015-09-17 DIAGNOSIS — E669 Obesity, unspecified: Secondary | ICD-10-CM

## 2015-09-17 DIAGNOSIS — F329 Major depressive disorder, single episode, unspecified: Secondary | ICD-10-CM

## 2015-09-17 DIAGNOSIS — O99340 Other mental disorders complicating pregnancy, unspecified trimester: Secondary | ICD-10-CM

## 2015-09-17 DIAGNOSIS — Z8759 Personal history of other complications of pregnancy, childbirth and the puerperium: Secondary | ICD-10-CM

## 2015-09-17 DIAGNOSIS — Z36 Encounter for antenatal screening of mother: Secondary | ICD-10-CM

## 2015-09-17 DIAGNOSIS — O09523 Supervision of elderly multigravida, third trimester: Secondary | ICD-10-CM

## 2015-09-17 DIAGNOSIS — O4413 Placenta previa with hemorrhage, third trimester: Secondary | ICD-10-CM

## 2015-09-17 DIAGNOSIS — O4403 Placenta previa specified as without hemorrhage, third trimester: Secondary | ICD-10-CM

## 2015-09-17 DIAGNOSIS — O99213 Obesity complicating pregnancy, third trimester: Secondary | ICD-10-CM

## 2015-09-17 LAB — OB RESULTS CONSOLE GC/CHLAMYDIA: Gonorrhea: NEGATIVE

## 2015-09-17 NOTE — Progress Notes (Signed)
Subjective:  Whitney Petty is a [age] y.o. G3P1011 at 8376w4d being seen today for ongoing prenatal care.  She is currently monitored for the following issues for this high-risk pregnancy and has Supervision of other high risk pregnancy, antepartum; AMA (advanced maternal age) multigravida 35+; History of gestational hypertension; Obesity affecting pregnancy, antepartum; Obesity (BMI 35.0-39.9 without comorbidity) (HCC); Depression affecting pregnancy, antepartum; Placenta previa antepartum; and Abnormal maternal glucose tolerance, antepartum on her problem list.  Patient reports no complaints.  Contractions: Irregular. Vag. Bleeding: Small.  Movement: Present. Denies leaking of fluid.   The following portions of the patient's history were reviewed and updated as appropriate: allergies, current medications, past family history, past medical history, past social history, past surgical history and problem list. Problem list updated.  Objective:   Filed Vitals:   09/17/15 1012  BP: 114/77  Pulse: 91  Weight: 211 lb (95.709 kg)    Fetal Status: Fetal Heart Rate (bpm): 139 Fundal Height: 36 cm Movement: Present  Presentation: Vertex  General:  Alert, oriented and cooperative. Patient is in no acute distress.  Skin: Skin is warm and dry. No rash noted.   Cardiovascular: Normal heart rate noted  Respiratory: Normal respiratory effort, no problems with respiration noted  Abdomen: Soft, gravid, appropriate for gestational age. Pain/Pressure: Present     Pelvic: Cervical exam performed Dilation: 1 Effacement (%): Thick Station: Ballotable  Extremities: Normal range of motion.  Edema: None  Mental Status: Normal mood and affect. Normal behavior. Normal judgment and thought content.   Urinalysis: Urine Protein: Negative Urine Glucose: Negative  Assessment and Plan:  Pregnancy: G3P1011 at 5376w4d  1. Supervision of other high risk pregnancy, antepartum, third trimester Patient is doing well Cultures  today - Culture, beta strep (group b only) - GC/Chlamydia probe amp ([place]  2. AMA (advanced maternal age) multigravida 35+, third trimester Declined testing  3. History of gestational hypertension Continue ASA  4. Obesity affecting pregnancy, antepartum, third trimester   5. Depression affecting pregnancy, antepartum   6. Placenta previa antepartum, third trimester Resolved on 09/11/15 scan  Preterm labor symptoms and general obstetric precautions including but not limited to vaginal bleeding, contractions, leaking of fluid and fetal movement were reviewed in detail with the patient. Please refer to After Visit Summary for other counseling recommendations.  No Follow-up on file.   Catalina AntiguaPeggy Annalia Metzger, MD

## 2015-09-18 LAB — GC/CHLAMYDIA PROBE AMP (~~LOC~~) NOT AT ARMC
Chlamydia: NEGATIVE
Neisseria Gonorrhea: NEGATIVE

## 2015-09-19 LAB — CULTURE, BETA STREP (GROUP B ONLY)

## 2015-09-24 ENCOUNTER — Encounter (INDEPENDENT_AMBULATORY_CARE_PROVIDER_SITE_OTHER): Payer: Self-pay

## 2015-09-24 ENCOUNTER — Inpatient Hospital Stay (HOSPITAL_COMMUNITY)
Admission: AD | Admit: 2015-09-24 | Discharge: 2015-09-28 | DRG: 766 | Disposition: A | Discharge status: Home / Self Care | Payer: Medicaid Other | Source: Ambulatory Visit | Attending: Obstetrics & Gynecology | Admitting: Obstetrics & Gynecology

## 2015-09-24 ENCOUNTER — Encounter: Payer: Self-pay | Admitting: Obstetrics & Gynecology

## 2015-09-24 ENCOUNTER — Encounter (HOSPITAL_COMMUNITY): Payer: Self-pay | Admitting: *Deleted

## 2015-09-24 DIAGNOSIS — O328XX0 Maternal care for other malpresentation of fetus, not applicable or unspecified: Secondary | ICD-10-CM | POA: Diagnosis present

## 2015-09-24 DIAGNOSIS — Z833 Family history of diabetes mellitus: Secondary | ICD-10-CM | POA: Diagnosis not present

## 2015-09-24 DIAGNOSIS — O320XX0 Maternal care for unstable lie, not applicable or unspecified: Secondary | ICD-10-CM | POA: Diagnosis present

## 2015-09-24 DIAGNOSIS — E669 Obesity, unspecified: Secondary | ICD-10-CM | POA: Diagnosis present

## 2015-09-24 DIAGNOSIS — F329 Major depressive disorder, single episode, unspecified: Secondary | ICD-10-CM | POA: Diagnosis present

## 2015-09-24 DIAGNOSIS — O99214 Obesity complicating childbirth: Secondary | ICD-10-CM | POA: Diagnosis present

## 2015-09-24 DIAGNOSIS — O4202 Full-term premature rupture of membranes, onset of labor within 24 hours of rupture: Secondary | ICD-10-CM | POA: Diagnosis present

## 2015-09-24 DIAGNOSIS — Z3A37 37 weeks gestation of pregnancy: Secondary | ICD-10-CM

## 2015-09-24 DIAGNOSIS — O99344 Other mental disorders complicating childbirth: Secondary | ICD-10-CM | POA: Diagnosis present

## 2015-09-24 DIAGNOSIS — Z98891 History of uterine scar from previous surgery: Secondary | ICD-10-CM

## 2015-09-24 DIAGNOSIS — Z6835 Body mass index (BMI) 35.0-35.9, adult: Secondary | ICD-10-CM | POA: Diagnosis not present

## 2015-09-24 LAB — CBC
HCT: 34.8 % — ABNORMAL LOW (ref 36.0–46.0)
Hemoglobin: 11.5 g/dL — ABNORMAL LOW (ref 12.0–15.0)
MCH: 27.6 pg (ref 26.0–34.0)
MCHC: 33 g/dL (ref 30.0–36.0)
MCV: 83.5 fL (ref 78.0–100.0)
PLATELETS: 242 10*3/uL (ref 150–400)
RBC: 4.17 MIL/uL (ref 3.87–5.11)
RDW: 15.1 % (ref 11.5–15.5)
WBC: 10.2 10*3/uL (ref 4.0–10.5)

## 2015-09-24 LAB — TYPE AND SCREEN
ABO/RH(D): O POS
Antibody Screen: NEGATIVE

## 2015-09-24 LAB — POCT FERN TEST: POCT Fern Test: NEGATIVE

## 2015-09-24 LAB — ABO/RH: ABO/RH(D): O POS

## 2015-09-24 LAB — OB RESULTS CONSOLE GBS: STREP GROUP B AG: NEGATIVE

## 2015-09-24 LAB — AMNISURE RUPTURE OF MEMBRANE (ROM) NOT AT ARMC: Amnisure ROM: POSITIVE

## 2015-09-24 MED ORDER — TERBUTALINE SULFATE 1 MG/ML IJ SOLN
0.2500 mg | Freq: Once | INTRAMUSCULAR | Status: AC
  Administered 2015-09-24: 0.25 mg via SUBCUTANEOUS
  Filled 2015-09-24: qty 1

## 2015-09-24 MED ORDER — OXYCODONE-ACETAMINOPHEN 5-325 MG PO TABS: 2.0000 | ORAL_TABLET | ORAL | Status: DC | PRN

## 2015-09-24 MED ORDER — OXYTOCIN 40 UNITS IN LACTATED RINGERS INFUSION - SIMPLE MED: 2.5000 [IU]/h | INTRAVENOUS | Status: DC

## 2015-09-24 MED ORDER — LACTATED RINGERS IV SOLN: INTRAVENOUS | Status: DC

## 2015-09-24 MED ORDER — OXYCODONE-ACETAMINOPHEN 5-325 MG PO TABS: 1.0000 | ORAL_TABLET | ORAL | Status: DC | PRN

## 2015-09-24 MED ORDER — OXYTOCIN 40 UNITS IN LACTATED RINGERS INFUSION - SIMPLE MED
1.0000 m[IU]/min | INTRAVENOUS | Status: DC
Start: 2015-09-24 — End: 2015-09-25
  Administered 2015-09-24: 2 m[IU]/min via INTRAVENOUS
  Filled 2015-09-24: qty 1000

## 2015-09-24 MED ORDER — LACTATED RINGERS IV SOLN
INTRAVENOUS | Status: DC
  Administered 2015-09-24 (×2): via INTRAVENOUS

## 2015-09-24 MED ORDER — LIDOCAINE HCL (PF) 1 % IJ SOLN: 30.0000 mL | INTRAMUSCULAR | Status: DC | PRN

## 2015-09-24 MED ORDER — LACTATED RINGERS IV SOLN: 500.0000 mL | INTRAVENOUS | Status: DC | PRN

## 2015-09-24 MED ORDER — OXYTOCIN BOLUS FROM INFUSION: 500.0000 mL | INTRAVENOUS | Status: DC

## 2015-09-24 MED ORDER — ACETAMINOPHEN 325 MG PO TABS: 650.0000 mg | ORAL_TABLET | ORAL | Status: DC | PRN

## 2015-09-24 MED ORDER — SOD CITRATE-CITRIC ACID 500-334 MG/5ML PO SOLN
30.0000 mL | ORAL | Status: DC | PRN
  Administered 2015-09-25: 30 mL via ORAL
  Filled 2015-09-24: qty 15

## 2015-09-24 MED ORDER — ONDANSETRON HCL 4 MG/2ML IJ SOLN: 4.0000 mg | Freq: Four times a day (QID) | INTRAMUSCULAR | Status: DC | PRN

## 2015-09-24 MED ORDER — TERBUTALINE SULFATE 1 MG/ML IJ SOLN: 0.2500 mg | Freq: Once | INTRAMUSCULAR | Status: DC | PRN

## 2015-09-24 NOTE — MAU Provider Note (Signed)
  History     CSN: [id number]  Arrival date and time: 09/24/15 [id number]   None     Chief Complaint  Patient presents with  . Labor Eval   HPI Comments: G3P1011 @37 .4 wks c/o LOF around 0800 this morning. She describes as clear and red tinged. She in unable to quantify the amt but reports as enough to soak her underwear. She reports good FM. No ctx. She denies recent IC. Pregnancy complicated by AMA, placenta previa-resolved, pre-e in previous pregnancy.    OB History    Gravida Para Term Preterm AB TAB SAB Ectopic Multiple Living   3 1 1  1  1   1       History reviewed. No pertinent past medical history.  Past Surgical History  Procedure Laterality Date  . No past surgeries      Family History  Problem Relation Age of Onset  . Diabetes Mother     Social History  Substance Use Topics  . Smoking status: Never Smoker   . Smokeless tobacco: Never Used  . Alcohol Use: No    Allergies: No Known Allergies  Prescriptions prior to admission  Medication Sig Dispense Refill Last Dose  . aspirin 81 MG tablet Take 1 tablet (81 mg total) by mouth daily. 30 tablet 6 09/22/2015  . Prenatal Vit-Fe Fumarate-FA (PRENATAL MULTIVITAMIN) TABS tablet Take 1 tablet by mouth daily at 12 noon.   09/22/2015  . ranitidine (ZANTAC) 150 MG capsule Take 1 capsule (150 mg total) by mouth 2 (two) times daily. (Patient not taking: Reported on 09/24/2015) 60 capsule 3 Not Taking at Unknown time    Review of Systems  Constitutional: Negative.   HENT: Negative.   Eyes: Negative.   Respiratory: Negative.   Cardiovascular: Negative.   Gastrointestinal: Negative.   Genitourinary: Negative.   Musculoskeletal: Negative.   Skin: Negative.   Neurological: Negative.   Endo/Heme/Allergies: Negative.   Psychiatric/Behavioral: Negative.    Physical Exam   Blood pressure 108/64, pulse 93, temperature 98.7 F (37.1 C), temperature source Oral, resp. rate 16, height 5\' 6"  (1.676 m), weight 211 lb (95.709 kg),  last menstrual period 01/04/2015.  Physical Exam  Constitutional: She is oriented to person, place, and time. She appears well-developed and well-nourished.  HENT:  Head: Normocephalic and atraumatic.  Neck: Normal range of motion. Neck supple.  Cardiovascular: Normal rate.   Respiratory: Effort normal.  GI: Soft. Bowel sounds are normal.  gravid  Genitourinary: Vagina normal.  External: No lesions Vagina: rugated, parous, no pool SVE: 1cm/th/out of pelvis ?presenting part   Musculoskeletal: Normal range of motion.  Neurological: She is alert and oriented to person, place, and time.  Skin: Skin is warm and dry.  Psychiatric: She has a normal mood and affect.  EFM: 130 bpm, mod variability, + accels, no decels Toco: irregular, mild  Bedside sono: breech, head to maternal left  MAU Course  Procedures  MDM Amnisure positive. Dr. Jolayne Pantheronstant in to discuss options ECV vs PCS, pt agrees to ECV.  1235-Dr. Constant at bedside for ECV- to continue mngt of care.  Assessment and Plan  37.[redacted] weeks gestation Malpresentation Reactive NST  [name] 09/24/2015, 10:52 AM

## 2015-09-24 NOTE — Anesthesia Pain Management Evaluation Note (Signed)
  [facility] Pain Management Visit Note  Patient: Whitney FreezeJohanna Petty, 35 y.o., female  "Hello I am a member of the anesthesia team at [facility]. We have an anesthesia team available at all times to provide care throughout the hospital, including epidural management and anesthesia for C-section. I don't know your plan for the delivery whether it a natural birth, water birth, IV sedation, nitrous supplementation, doula or epidural, but we want to meet your pain goals."   1.Was your pain managed to your expectations on prior hospitalizations?   No   2.What is your expectation for pain management during this hospitalization?     Epidural  3.How can we help you reach that goal? Epidural when possible  Record the patient's initial score and the patient's pain goal.   Pain: 0  Pain Goal: 6 [facility] wants you to be able to say your pain was always managed very well.  Jonan Seufert 09/24/2015

## 2015-09-24 NOTE — H&P (Signed)
Whitney FreezeJohanna Petty is a 35 y.o. female presenting for SROM, confirmed by Amnisure, noted to be breech by bedside sono, consent for ECV, ECV performed and successful. Pregnancy complicated by AMA, placenta previa-resolved, and pre-e in previous pregnancy.   Maternal Medical History:  Reason for admission: Rupture of membranes.   Contractions: Onset was 3-5 hours ago.    Fetal activity: Perceived fetal activity is normal.   Last perceived fetal movement was within the past hour.      OB History    Gravida Para Term Preterm AB TAB SAB Ectopic Multiple Living   3 1 1  1  1   1      History reviewed. No pertinent past medical history. Past Surgical History  Procedure Laterality Date  . No past surgeries     Family History: family history includes Diabetes in her mother. Social History:  reports that she has never smoked. She has never used smokeless tobacco. She reports that she does not drink alcohol or use illicit drugs.   Prenatal Transfer Tool  Maternal Diabetes: No Genetic Screening: Normal Maternal Ultrasounds/Referrals: Normal Fetal Ultrasounds or other Referrals:  None Maternal Substance Abuse:  No Significant Maternal Medications:  None Significant Maternal Lab Results:  None Other Comments:  None  Review of Systems  Constitutional: Negative.   HENT: Negative.   Eyes: Negative.   Respiratory: Negative.   Cardiovascular: Negative.   Gastrointestinal: Negative.   Genitourinary: Negative.   Musculoskeletal: Negative.   Skin: Negative.   Neurological: Negative.   Endo/Heme/Allergies: Negative.   Psychiatric/Behavioral: Negative.     Dilation: 1 Effacement (%): Thick Station: Ballotable Exam by:: Donette LarryMelanie Deamonte Sayegh Blood pressure 108/64, pulse 93, temperature 98.7 F (37.1 C), temperature source Oral, resp. rate 16, height 5\' 6"  (1.676 m), weight 211 lb (95.709 kg), last menstrual period 01/04/2015. Maternal Exam:  Uterine Assessment: Contraction strength is mild.   Contraction frequency is rare.   Abdomen: Patient reports no abdominal tenderness.   Physical Exam  Constitutional: She is oriented to person, place, and time. She appears well-developed and well-nourished.  HENT:  Head: Normocephalic and atraumatic.  Neck: Normal range of motion. Neck supple.  Cardiovascular: Normal rate.   Respiratory: Effort normal.  GI: Soft.  gravid  Genitourinary:  SVE 1/thick  Musculoskeletal: Normal range of motion.  Neurological: She is alert and oriented to person, place, and time.  Skin: Skin is warm and dry.  Psychiatric: She has a normal mood and affect.  EFM: 130 bpm, mod variability, + accels, no decels Toco: irregular, mild   Prenatal labs: ABO, Rh: --/--/O POS (07/06 1135) Antibody: PENDING (07/06 1135) Rubella: 4.34 (01/05 1040) RPR: NON REAC (05/05 1010)  HBsAg: NEGATIVE (01/05 1040)  HIV: NONREACTIVE (05/05 1010)  GBS:  NEG  Assessment/Plan: 37.[redacted] weeks gestation PROM at term S/p successful ECV Admit, IOL, analgesia/anesthsia prn, anticipate SVD.  Donette LarryMelanie Cullen Lahaie, CNM 09/24/2015, 12:53 PM

## 2015-09-24 NOTE — MAU Note (Signed)
Patient presents at [redacted] weeks gestation with c/o bleeding and leaking. Fetus active but decreased. Denies pain.

## 2015-09-24 NOTE — Progress Notes (Signed)
External Cephalic Version  Preprocedure Diagnosis:  35 y.o. G3P1011 at 37w4 weeks gestational age with fetus in breech presentation and SROM without labor  Post-procedure Diagnosis: 35 y.o. G3P1011 at 3237w4 weeks gestational age with fetus in cephalic presentation  Procedure:  External cephalic version  Procedure in detail:   The patient was seen in MAU where a reactive fetal heart tracing was obtained. The patient was noted not to have contractions. She was given 1 dose of subcutaneous terbutaline. A bedside ultrasound was performed which revealed single intrauterine pregnancy and frank breech presentation. There was noted to be adequate fluid. Using manual pressure, the breech was manipulated in a forward roll fashion until a vertex presentation was obtained. Fetal heart tones were checked intermittently during the procedure and were noted to be reassuring. Following successful external cephalic version, the patient was placed on continuous external fetal monitoring. She was transferred to birthing suite to start induction of labor secondary to spontaneous rupture of membrane.

## 2015-09-25 ENCOUNTER — Encounter (HOSPITAL_COMMUNITY)
Admission: AD | Disposition: A | Discharge status: Home / Self Care | Payer: Self-pay | Source: Ambulatory Visit | Attending: Obstetrics & Gynecology

## 2015-09-25 ENCOUNTER — Encounter (HOSPITAL_COMMUNITY): Payer: Self-pay | Admitting: *Deleted

## 2015-09-25 ENCOUNTER — Inpatient Hospital Stay (HOSPITAL_COMMUNITY): Payer: Medicaid Other | Admitting: Anesthesiology

## 2015-09-25 DIAGNOSIS — Z98891 History of uterine scar from previous surgery: Secondary | ICD-10-CM

## 2015-09-25 DIAGNOSIS — O328XX0 Maternal care for other malpresentation of fetus, not applicable or unspecified: Secondary | ICD-10-CM

## 2015-09-25 DIAGNOSIS — O4202 Full-term premature rupture of membranes, onset of labor within 24 hours of rupture: Secondary | ICD-10-CM

## 2015-09-25 LAB — RPR: RPR: NONREACTIVE

## 2015-09-25 LAB — CBC
HCT: 32.7 % — ABNORMAL LOW (ref 36.0–46.0)
Hemoglobin: 10.7 g/dL — ABNORMAL LOW (ref 12.0–15.0)
MCH: 27.4 pg (ref 26.0–34.0)
MCHC: 32.7 g/dL (ref 30.0–36.0)
MCV: 83.8 fL (ref 78.0–100.0)
PLATELETS: 228 10*3/uL (ref 150–400)
RBC: 3.9 MIL/uL (ref 3.87–5.11)
RDW: 15.2 % (ref 11.5–15.5)
WBC: 12 10*3/uL — ABNORMAL HIGH (ref 4.0–10.5)

## 2015-09-25 SURGERY — Surgical Case
Anesthesia: Spinal

## 2015-09-25 MED ORDER — ONDANSETRON HCL 4 MG/2ML IJ SOLN
INTRAMUSCULAR | Status: DC | PRN
  Administered 2015-09-25: 4 mg via INTRAVENOUS

## 2015-09-25 MED ORDER — OXYTOCIN 40 UNITS IN LACTATED RINGERS INFUSION - SIMPLE MED: 2.5000 [IU]/h | INTRAVENOUS | Status: AC

## 2015-09-25 MED ORDER — PHENYLEPHRINE HCL 10 MG/ML IJ SOLN
INTRAMUSCULAR | Status: DC | PRN
  Administered 2015-09-25: 40 ug via INTRAVENOUS
  Administered 2015-09-25 (×2): 80 ug via INTRAVENOUS
  Administered 2015-09-25: 40 ug via INTRAVENOUS

## 2015-09-25 MED ORDER — NALBUPHINE HCL 10 MG/ML IJ SOLN: 5.0000 mg | INTRAMUSCULAR | Status: DC | PRN

## 2015-09-25 MED ORDER — OXYCODONE-ACETAMINOPHEN 5-325 MG PO TABS
1.0000 | ORAL_TABLET | ORAL | Status: DC | PRN
  Administered 2015-09-26: 1 via ORAL
  Filled 2015-09-25: qty 1

## 2015-09-25 MED ORDER — PROMETHAZINE HCL 25 MG/ML IJ SOLN: 6.2500 mg | INTRAMUSCULAR | Status: DC | PRN

## 2015-09-25 MED ORDER — OXYCODONE-ACETAMINOPHEN 5-325 MG PO TABS
2.0000 | ORAL_TABLET | ORAL | Status: DC | PRN
  Administered 2015-09-26 – 2015-09-28 (×9): 2 via ORAL
  Filled 2015-09-25 (×9): qty 2

## 2015-09-25 MED ORDER — SCOPOLAMINE 1 MG/3DAYS TD PT72
MEDICATED_PATCH | TRANSDERMAL | Status: DC | PRN
  Administered 2015-09-25: 1 via TRANSDERMAL

## 2015-09-25 MED ORDER — OXYTOCIN 40 UNITS IN LACTATED RINGERS INFUSION - SIMPLE MED
1.0000 m[IU]/min | INTRAVENOUS | Status: DC
  Filled 2015-09-25: qty 1000

## 2015-09-25 MED ORDER — LACTATED RINGERS IV SOLN
INTRAVENOUS | Status: DC | PRN
  Administered 2015-09-25: 19:00:00 via INTRAVENOUS

## 2015-09-25 MED ORDER — PRENATAL MULTIVITAMIN CH
1.0000 | ORAL_TABLET | Freq: Every day | ORAL | Status: DC
  Administered 2015-09-26 – 2015-09-27 (×2): 1 via ORAL
  Filled 2015-09-25 (×2): qty 1

## 2015-09-25 MED ORDER — NALOXONE HCL 0.4 MG/ML IJ SOLN: 0.4000 mg | INTRAMUSCULAR | Status: DC | PRN

## 2015-09-25 MED ORDER — KETOROLAC TROMETHAMINE 30 MG/ML IJ SOLN: 30.0000 mg | Freq: Four times a day (QID) | INTRAMUSCULAR | Status: AC | PRN

## 2015-09-25 MED ORDER — MISOPROSTOL 50MCG HALF TABLET
50.0000 ug | ORAL_TABLET | ORAL | Status: DC
  Administered 2015-09-25 (×2): 50 ug via ORAL
  Filled 2015-09-25 (×2): qty 0.5

## 2015-09-25 MED ORDER — PHENYLEPHRINE 8 MG IN D5W 100 ML (0.08MG/ML) PREMIX OPTIME
INJECTION | INTRAVENOUS | Status: DC | PRN
  Administered 2015-09-25: 60 ug/min via INTRAVENOUS

## 2015-09-25 MED ORDER — FENTANYL CITRATE (PF) 100 MCG/2ML IJ SOLN
INTRAMUSCULAR | Status: AC
  Filled 2015-09-25: qty 2

## 2015-09-25 MED ORDER — PHENYLEPHRINE 40 MCG/ML (10ML) SYRINGE FOR IV PUSH (FOR BLOOD PRESSURE SUPPORT)
PREFILLED_SYRINGE | INTRAVENOUS | Status: AC
  Filled 2015-09-25: qty 10

## 2015-09-25 MED ORDER — ZOLPIDEM TARTRATE 5 MG PO TABS: 5.0000 mg | ORAL_TABLET | Freq: Every evening | ORAL | Status: DC | PRN

## 2015-09-25 MED ORDER — DIPHENHYDRAMINE HCL 25 MG PO CAPS: 25.0000 mg | ORAL_CAPSULE | ORAL | Status: DC | PRN

## 2015-09-25 MED ORDER — NALBUPHINE HCL 10 MG/ML IJ SOLN: 5.0000 mg | Freq: Once | INTRAMUSCULAR | Status: DC | PRN

## 2015-09-25 MED ORDER — LACTATED RINGERS IV SOLN
INTRAVENOUS | Status: DC
  Administered 2015-09-25 – 2015-09-26 (×2): via INTRAVENOUS

## 2015-09-25 MED ORDER — OXYTOCIN 10 UNIT/ML IJ SOLN
INTRAMUSCULAR | Status: AC
  Filled 2015-09-25: qty 4

## 2015-09-25 MED ORDER — FENTANYL CITRATE (PF) 100 MCG/2ML IJ SOLN
100.0000 ug | INTRAMUSCULAR | Status: DC | PRN
  Administered 2015-09-25 (×3): 100 ug via INTRAVENOUS
  Filled 2015-09-25 (×3): qty 2

## 2015-09-25 MED ORDER — LACTATED RINGERS IV SOLN
INTRAVENOUS | Status: DC | PRN
  Administered 2015-09-25 (×4): via INTRAVENOUS

## 2015-09-25 MED ORDER — LACTATED RINGERS IV SOLN: INTRAVENOUS | Status: DC

## 2015-09-25 MED ORDER — SIMETHICONE 80 MG PO CHEW
80.0000 mg | CHEWABLE_TABLET | ORAL | Status: DC
  Administered 2015-09-27 – 2015-09-28 (×2): 80 mg via ORAL
  Filled 2015-09-25 (×2): qty 1

## 2015-09-25 MED ORDER — MORPHINE SULFATE (PF) 0.5 MG/ML IJ SOLN
INTRAMUSCULAR | Status: AC
  Filled 2015-09-25: qty 10

## 2015-09-25 MED ORDER — EPHEDRINE SULFATE 50 MG/ML IJ SOLN
INTRAMUSCULAR | Status: DC | PRN
  Administered 2015-09-25: 5 mg via INTRAVENOUS

## 2015-09-25 MED ORDER — PRENATAL MULTIVITAMIN CH: 1.0000 | ORAL_TABLET | Freq: Every day | ORAL | Status: DC

## 2015-09-25 MED ORDER — DIPHENHYDRAMINE HCL 50 MG/ML IJ SOLN: 12.5000 mg | INTRAMUSCULAR | Status: DC | PRN

## 2015-09-25 MED ORDER — ONDANSETRON HCL 4 MG/2ML IJ SOLN
INTRAMUSCULAR | Status: AC
  Filled 2015-09-25: qty 2

## 2015-09-25 MED ORDER — METHYLERGONOVINE MALEATE 0.2 MG/ML IJ SOLN
INTRAMUSCULAR | Status: AC
  Filled 2015-09-25: qty 1

## 2015-09-25 MED ORDER — KETOROLAC TROMETHAMINE 30 MG/ML IJ SOLN
30.0000 mg | Freq: Four times a day (QID) | INTRAMUSCULAR | Status: AC | PRN
  Administered 2015-09-25: 30 mg via INTRAVENOUS
  Filled 2015-09-25: qty 1

## 2015-09-25 MED ORDER — DIPHENHYDRAMINE HCL 25 MG PO CAPS: 25.0000 mg | ORAL_CAPSULE | Freq: Four times a day (QID) | ORAL | Status: DC | PRN

## 2015-09-25 MED ORDER — SENNOSIDES-DOCUSATE SODIUM 8.6-50 MG PO TABS
2.0000 | ORAL_TABLET | ORAL | Status: DC
  Administered 2015-09-27 – 2015-09-28 (×2): 2 via ORAL
  Filled 2015-09-25 (×2): qty 2

## 2015-09-25 MED ORDER — WITCH HAZEL-GLYCERIN EX PADS: 1.0000 "application " | MEDICATED_PAD | CUTANEOUS | Status: DC | PRN

## 2015-09-25 MED ORDER — MENTHOL 3 MG MT LOZG: 1.0000 | LOZENGE | OROMUCOSAL | Status: DC | PRN

## 2015-09-25 MED ORDER — SODIUM CHLORIDE 0.9 % IR SOLN
Status: DC | PRN
  Administered 2015-09-25: 1000 mL

## 2015-09-25 MED ORDER — ONDANSETRON HCL 4 MG/2ML IJ SOLN: 4.0000 mg | Freq: Three times a day (TID) | INTRAMUSCULAR | Status: DC | PRN

## 2015-09-25 MED ORDER — SODIUM CHLORIDE 0.9% FLUSH: 3.0000 mL | INTRAVENOUS | Status: DC | PRN

## 2015-09-25 MED ORDER — SIMETHICONE 80 MG PO CHEW: 80.0000 mg | CHEWABLE_TABLET | ORAL | Status: DC | PRN

## 2015-09-25 MED ORDER — BUPIVACAINE HCL (PF) 0.5 % IJ SOLN
INTRAMUSCULAR | Status: AC
  Filled 2015-09-25: qty 30

## 2015-09-25 MED ORDER — TETANUS-DIPHTH-ACELL PERTUSSIS 5-2.5-18.5 LF-MCG/0.5 IM SUSP: 0.5000 mL | Freq: Once | INTRAMUSCULAR | Status: DC

## 2015-09-25 MED ORDER — METHYLERGONOVINE MALEATE 0.2 MG/ML IJ SOLN: 0.2000 mg | INTRAMUSCULAR | Status: DC | PRN

## 2015-09-25 MED ORDER — FENTANYL CITRATE (PF) 100 MCG/2ML IJ SOLN
25.0000 ug | INTRAMUSCULAR | Status: DC | PRN
  Administered 2015-09-25: 50 ug via INTRAVENOUS
  Administered 2015-09-25: 25 ug via INTRAVENOUS

## 2015-09-25 MED ORDER — CEFAZOLIN SODIUM-DEXTROSE 2-4 GM/100ML-% IV SOLN
2.0000 g | INTRAVENOUS | Status: AC
  Administered 2015-09-25: 2 g via INTRAVENOUS

## 2015-09-25 MED ORDER — COCONUT OIL OIL: 1.0000 "application " | TOPICAL_OIL | Status: DC | PRN

## 2015-09-25 MED ORDER — PHENYLEPHRINE 8 MG IN D5W 100 ML (0.08MG/ML) PREMIX OPTIME
INJECTION | INTRAVENOUS | Status: AC
  Filled 2015-09-25: qty 100

## 2015-09-25 MED ORDER — OXYTOCIN 40 UNITS IN LACTATED RINGERS INFUSION - SIMPLE MED
INTRAVENOUS | Status: DC | PRN
  Administered 2015-09-25: 40 [IU] via INTRAVENOUS

## 2015-09-25 MED ORDER — ACETAMINOPHEN 500 MG PO TABS
1000.0000 mg | ORAL_TABLET | Freq: Four times a day (QID) | ORAL | Status: AC
  Administered 2015-09-25: 1000 mg via ORAL
  Filled 2015-09-25: qty 2

## 2015-09-25 MED ORDER — TERBUTALINE SULFATE 1 MG/ML IJ SOLN: 0.2500 mg | Freq: Once | INTRAMUSCULAR | Status: DC | PRN

## 2015-09-25 MED ORDER — SCOPOLAMINE 1 MG/3DAYS TD PT72
MEDICATED_PATCH | TRANSDERMAL | Status: AC
  Filled 2015-09-25: qty 1

## 2015-09-25 MED ORDER — ACETAMINOPHEN 325 MG PO TABS: 650.0000 mg | ORAL_TABLET | ORAL | Status: DC | PRN

## 2015-09-25 MED ORDER — IBUPROFEN 600 MG PO TABS
600.0000 mg | ORAL_TABLET | Freq: Four times a day (QID) | ORAL | Status: DC
  Administered 2015-09-26 – 2015-09-28 (×9): 600 mg via ORAL
  Filled 2015-09-25 (×9): qty 1

## 2015-09-25 MED ORDER — SIMETHICONE 80 MG PO CHEW
80.0000 mg | CHEWABLE_TABLET | Freq: Three times a day (TID) | ORAL | Status: DC
  Administered 2015-09-26 – 2015-09-28 (×6): 80 mg via ORAL
  Filled 2015-09-25 (×6): qty 1

## 2015-09-25 MED ORDER — METHYLERGONOVINE MALEATE 0.2 MG/ML IJ SOLN
INTRAMUSCULAR | Status: DC | PRN
  Administered 2015-09-25: 0.2 mg via INTRAMUSCULAR

## 2015-09-25 MED ORDER — NALOXONE HCL 2 MG/2ML IJ SOSY: 1.0000 ug/kg/h | PREFILLED_SYRINGE | INTRAVENOUS | Status: DC | PRN

## 2015-09-25 MED ORDER — DIBUCAINE 1 % RE OINT: 1.0000 "application " | TOPICAL_OINTMENT | RECTAL | Status: DC | PRN

## 2015-09-25 MED ORDER — METHYLERGONOVINE MALEATE 0.2 MG PO TABS: 0.2000 mg | ORAL_TABLET | ORAL | Status: DC | PRN

## 2015-09-25 SURGICAL SUPPLY — 31 items
BARRIER ADHS 3X4 INTERCEED (GAUZE/BANDAGES/DRESSINGS) IMPLANT
CLAMP CORD UMBIL (MISCELLANEOUS) IMPLANT
CLOTH BEACON ORANGE TIMEOUT ST (SAFETY) ×3 IMPLANT
DRSG OPSITE POSTOP 4X10 (GAUZE/BANDAGES/DRESSINGS) ×3 IMPLANT
DURAPREP 26ML APPLICATOR (WOUND CARE) ×3 IMPLANT
ELECT REM PT RETURN 9FT ADLT (ELECTROSURGICAL) ×3
ELECTRODE REM PT RTRN 9FT ADLT (ELECTROSURGICAL) ×1 IMPLANT
EXTRACTOR VACUUM KIWI (MISCELLANEOUS) IMPLANT
GLOVE BIO SURGEON STRL SZ 6.5 (GLOVE) ×2 IMPLANT
GLOVE BIO SURGEONS STRL SZ 6.5 (GLOVE) ×1
GLOVE BIOGEL PI IND STRL 7.0 (GLOVE) ×2 IMPLANT
GLOVE BIOGEL PI INDICATOR 7.0 (GLOVE) ×4
GOWN STRL REUS W/TWL LRG LVL3 (GOWN DISPOSABLE) ×6 IMPLANT
KIT ABG SYR 3ML LUER SLIP (SYRINGE) IMPLANT
LIQUID BAND (GAUZE/BANDAGES/DRESSINGS) ×3 IMPLANT
NEEDLE HYPO 22GX1.5 SAFETY (NEEDLE) IMPLANT
NEEDLE HYPO 25X5/8 SAFETYGLIDE (NEEDLE) IMPLANT
NS IRRIG 1000ML POUR BTL (IV SOLUTION) ×3 IMPLANT
PACK C SECTION WH (CUSTOM PROCEDURE TRAY) ×3 IMPLANT
PAD OB MATERNITY 4.3X12.25 (PERSONAL CARE ITEMS) ×3 IMPLANT
PENCIL SMOKE EVAC W/HOLSTER (ELECTROSURGICAL) ×3 IMPLANT
RETRACTOR WND ALEXIS 25 LRG (MISCELLANEOUS) IMPLANT
RTRCTR WOUND ALEXIS 25CM LRG (MISCELLANEOUS)
SUT VIC AB 0 CT1 36 (SUTURE) ×18 IMPLANT
SUT VIC AB 2-0 CT1 27 (SUTURE) ×2
SUT VIC AB 2-0 CT1 TAPERPNT 27 (SUTURE) ×1 IMPLANT
SUT VIC AB 4-0 KS 27 (SUTURE) ×3 IMPLANT
SUT VIC AB 4-0 PS2 27 (SUTURE) ×3 IMPLANT
SYR CONTROL 10ML LL (SYRINGE) IMPLANT
TOWEL OR 17X24 6PK STRL BLUE (TOWEL DISPOSABLE) ×3 IMPLANT
TRAY FOLEY CATH SILVER 14FR (SET/KITS/TRAYS/PACK) IMPLANT

## 2015-09-25 NOTE — Progress Notes (Signed)
Filed Vitals:   09/25/15 0821 09/25/15 1037 09/25/15 1330 09/25/15 1333  BP: 102/66 102/70  96/65  Pulse: 77 92  107  Temp: 98.4 F (36.9 C) 98.3 F (36.8 C) 98.7 F (37.1 C)   TempSrc: Oral Oral Oral   Resp: 18 20 18    Height:      Weight:      SpO2:        FHR reactive UCs irregular and mild  Dilation: 3.5 Effacement (%): 50 Cervical Position: Middle Station: -2 Presentation: Vertex Exam by:: Dr. Chanetta Marshallimberlake & Enis SlipperJane Bailey, RN  Will progress to Pitocin augmentation

## 2015-09-25 NOTE — Progress Notes (Signed)
Cyndy FreezeJohanna Luff is a 35 y.o. G3P1011 at 1724w5d by LMP admitted for rupture of membranes  Subjective:mild contractions, nurse reports no presenting part on vaginal exam   Objective: BP 96/65 mmHg  Pulse 107  Temp(Src) 98.7 F (37.1 C) (Oral)  Resp 18  Ht 5\' 6"  (1.676 m)  Wt 95.709 kg (211 lb)  BMI 34.07 kg/m2  SpO2 95%  LMP 01/04/2015 (Exact Date)     Fetal Heart Rate A      Mode  External filed at 09/25/2015 0900    Baseline Rate (A)  160 bpm filed at 09/25/2015 1556    Variability  6-25 BPM filed at 09/25/2015 1520    Accelerations  15 x 15 filed at 09/25/2015 1520    Decelerations  None filed at 09/25/2015 1520    Scalp Stimulation  Positive filed at 09/25/2015 1021    Multiple birth?  N filed at 09/24/2015 1000      UC:   regular, every 4 minutes SVE:   Dilation:  (unable to determine) Effacement (%): 50 Station: -2 Exam by:: soliz rn  US at bedside transverse head left low AF Labs: Lab Results  Component Value Date   WBC 10.2 09/24/2015   HGB 11.5* 09/24/2015   HCT 34.8* 09/24/2015   MCV 83.5 09/24/2015   PLT 242 09/24/2015    Assessment / Plan: Transverse lie, unstable after ECV yesterday  Labor: malpresentation Preeclampsia:  no signs or symptoms of toxicity Fetal Wellbeing:  Category I Pain Control:  Labor support without medications I/D:  n/a Anticipated MOD:  Cesarean delivery, The risks of cesarean section discussed with the patient included but were not limited to: bleeding which may require transfusion or reoperation; infection which may require antibiotics; injury to bowel, bladder, ureters or other surrounding organs; injury to the fetus; need for additional procedures including hysterectomy in the event of a life-threatening hemorrhage; placental abnormalities wth subsequent pregnancies, incisional problems, thromboembolic phenomenon and other postoperative/anesthesia complications. The patient concurred with the proposed plan, giving  informed written consent for the procedure.   Patient has been NPO since 1500 clear liquids she will remain NPO for procedure. Anesthesia and OR aware. Preoperative prophylactic antibiotics and SCDs ordered on call to the OR.  To OR when ready.     Chasity Outten 09/25/2015, 4:14 PM

## 2015-09-25 NOTE — Transfer of Care (Signed)
Immediate Anesthesia Transfer of Care Note  Patient: Whitney FreezeJohanna Petty  Procedure(s) Performed: Procedure(s): CESAREAN SECTION (N/A)  Patient Location: PACU  Anesthesia Type:Spinal  Level of Consciousness: awake, alert  and oriented  Airway & Oxygen Therapy: Patient Spontanous Breathing  Post-op Assessment: Report given to RN and Post -op Vital signs reviewed and stable  Post vital signs: Reviewed and stable  Last Vitals:  Filed Vitals:   09/25/15 1330 09/25/15 1333  BP:  96/65  Pulse:  107  Temp: 37.1 C   Resp: 18     Last Pain:  Filed Vitals:   09/25/15 1828  PainSc: 8          Complications: No apparent anesthesia complications

## 2015-09-25 NOTE — Progress Notes (Signed)
LABOR PROGRESS NOTE  Cyndy FreezeJohanna Echevarria is a 35 y.o. G3P1011 at 8429w5d  admitted for IOL after ECV and ROF.   Subjective: Mild pain.   Objective: BP 102/66 mmHg  Pulse 77  Temp(Src) 98.4 F (36.9 C) (Oral)  Resp 18  Ht 5\' 6"  (1.676 m)  Wt 95.709 kg (211 lb)  BMI 34.07 kg/m2  SpO2 95%  LMP 01/04/2015 (Exact Date) or  Filed Vitals:   09/25/15 0216 09/25/15 0301 09/25/15 0659 09/25/15 0821  BP: 96/60 84/48 120/70 102/66  Pulse: 85 101 90 77  Temp:    98.4 F (36.9 C)  TempSrc:    Oral  Resp:    18  Height:      Weight:      SpO2:         Dilation: 2.5 Effacement (%): 50, 60 Cervical Position: Middle, Posterior Station: -3 Presentation: Vertex Exam by:: Irving BurtonEmily Rothermel RN   Labs: Lab Results  Component Value Date   WBC 10.2 09/24/2015   HGB 11.5* 09/24/2015   HCT 34.8* 09/24/2015   MCV 83.5 09/24/2015   PLT 242 09/24/2015    Patient Active Problem List   Diagnosis Date Noted  . Unstable lie of fetus 09/24/2015  . Abnormal maternal glucose tolerance, antepartum 08/20/2015  . Placenta previa antepartum 05/12/2015  . Supervision of other high risk pregnancy, antepartum 03/26/2015  . AMA (advanced maternal age) multigravida 35+ 03/26/2015  . History of gestational hypertension 03/26/2015  . Obesity affecting pregnancy, antepartum 03/26/2015  . Obesity (BMI 35.0-39.9 without comorbidity) (HCC) 03/26/2015  . Depression affecting pregnancy, antepartum 03/26/2015    Assessment / Plan: 35 y.o. G3P1011 at 6129w5d here for IOL after ECV and ROF.  Labor: foley out 1000, oral cytotec x2 Fetal Wellbeing:  Cat 1 Pain Control:  none Anticipated MOD:  SVD  Loni MuseKate Eion Timbrook, MD 09/25/2015, 10:27 AM

## 2015-09-25 NOTE — Anesthesia Preprocedure Evaluation (Signed)
Anesthesia Evaluation  Patient identified by MRN, date of birth, ID band Patient awake    Reviewed: Allergy & Precautions, NPO status , Patient's Chart, lab work & pertinent test results  Airway Mallampati: II  TM Distance: >3 FB Neck ROM: Full    Dental  (+) Teeth Intact, Dental Advisory Given   Pulmonary neg pulmonary ROS,    Pulmonary exam normal breath sounds clear to auscultation       Cardiovascular Exercise Tolerance: Good negative cardio ROS Normal cardiovascular exam Rhythm:Regular Rate:Normal     Neuro/Psych PSYCHIATRIC DISORDERS Depression negative neurological ROS     GI/Hepatic negative GI ROS, Neg liver ROS,   Endo/Other  negative endocrine ROS  Renal/GU negative Renal ROS     Musculoskeletal negative musculoskeletal ROS (+)   Abdominal   Peds  Hematology  (+) Blood dyscrasia, anemia , Plt 228k   Anesthesia Other Findings Day of surgery medications reviewed with the patient.  Reproductive/Obstetrics (+) Pregnancy                             Anesthesia Physical Anesthesia Plan  ASA: II  Anesthesia Plan: Spinal   Post-op Pain Management:    Induction:   Airway Management Planned:   Additional Equipment:   Intra-op Plan:   Post-operative Plan:   Informed Consent: I have reviewed the patients History and Physical, chart, labs and discussed the procedure including the risks, benefits and alternatives for the proposed anesthesia with the patient or authorized representative who has indicated his/her understanding and acceptance.   Dental advisory given  Plan Discussed with: CRNA, Anesthesiologist and Surgeon  Anesthesia Plan Comments: (Discussed risks and benefits of and differences between spinal and general. Discussed risks of spinal including headache, backache, failure, bleeding, infection, and nerve damage. Patient consents to spinal. Questions answered.  Coagulation studies and platelet count acceptable.  **Spanish interpreter present and utilized during entire patient encounter.**)        Anesthesia Quick Evaluation

## 2015-09-25 NOTE — Progress Notes (Signed)
LABOR PROGRESS NOTE  Cyndy FreezeJohanna Pelot is a 35 y.o. G3P1011 at 3031w5d  admitted for prom  Subjective: No pain, no fever  Objective: BP 84/48 mmHg  Pulse 101  Temp(Src) 98.3 F (36.8 C) (Oral)  Resp 18  Ht 5\' 6"  (1.676 m)  Wt 211 lb (95.709 kg)  BMI 34.07 kg/m2  SpO2 95%  LMP 01/04/2015 (Exact Date) or  Filed Vitals:   09/25/15 0035 09/25/15 0215 09/25/15 0216 09/25/15 0301  BP:   96/60 84/48  Pulse: 85  85 101  Temp:  98.3 F (36.8 C)    TempSrc:  Oral    Resp:  18    Height:      Weight:      SpO2: 95%       145/mod/+a/-dDilation: 2.5 Effacement (%): 50, 60 Cervical Position: Middle, Posterior Station: -3 Presentation: Vertex Exam by:: Irving BurtonEmily Rothermel RN   Labs: Lab Results  Component Value Date   WBC 10.2 09/24/2015   HGB 11.5* 09/24/2015   HCT 34.8* 09/24/2015   MCV 83.5 09/24/2015   PLT 242 09/24/2015    Patient Active Problem List   Diagnosis Date Noted  . Unstable lie of fetus 09/24/2015  . Abnormal maternal glucose tolerance, antepartum 08/20/2015  . Placenta previa antepartum 05/12/2015  . Supervision of other high risk pregnancy, antepartum 03/26/2015  . AMA (advanced maternal age) multigravida 35+ 03/26/2015  . History of gestational hypertension 03/26/2015  . Obesity affecting pregnancy, antepartum 03/26/2015  . Obesity (BMI 35.0-39.9 without comorbidity) (HCC) 03/26/2015  . Depression affecting pregnancy, antepartum 03/26/2015    Assessment / Plan: 35 y.o. G3P1011 at 4331w5d here for prom  Labor: s/p 14 hrs pitocin, not feeling painfulc ontractions, no significant cervical change. I've stopped piocin, placed a foley catheter, and wills tart oral cytotec when contraction frequency allows Fetal Wellbeing:  Cat 1 Pain Control:  Non-pharm for now Anticipated MOD:  Vag Prom: no s/s triple I  Silvano BilisNoah B Lindi Abram, MD 09/25/2015, 3:54 AM

## 2015-09-25 NOTE — Op Note (Signed)
Preoperative diagnosis:  1.  Intrauterine pregnancy at 4467w5d  weeks gestation                                         2.  Unstable lie                                         3.  SROM   Postoperative diagnosis:  Same as above plus double footling breech  Procedure:  Primary cesarean section  Surgeon:  Lazaro ArmsLuther H Emilea Goga MD  Assistant:    Anesthesia: Spinal  Findings:  .    Over a low transverse incision was delivered a viable female with Apgars of 9 and 9 weighing pending lbs.  oz. Uterus, tubes and ovaries were all normal.  There were no other significant findings  Description of operation:  Patient was taken to the operating room and placed in the sitting position where she underwent a spinal anesthetic. She was then placed in the supine position with tilt to the left side. When adequate anesthetic level was obtained she was prepped and draped in usual sterile fashion and a Foley catheter was placed. A Pfannenstiel skin incision was made and carried down sharply to the rectus fascia which was scored in the midline extended laterally. The fascia was taken off the muscles both superiorly and without difficulty. The muscles were divided.  The peritoneal cavity was entered.  Bladder blade was placed, no bladder flap was created.  A low transverse hysterotomy incision was made and delivered a viable female  Infant in the double footling breech presentation at 1820 with Apgars of 9 and 9 weighing pendinglbs  oz.  Cord pH was obtained and was pending. The uterus was exteriorized. It was closed in 2 layers, the first being a running interlocking layer and the second being an imbricating layer using 0 monocryl on a CTX needle. There was good resulting hemostasis. The uterus tubes and ovaries were all normal. Peritoneal cavity was irrigated vigorously. The muscles and peritoneum were reapproximated loosely. The fascia was closed using 0 Vicryl in running fashion. Subcutaneous tissue was made hemostatic and  irrigated. The skin was closed using 4-0 Vicryl on a Keith needle in a subcuticular fashion.  Dermabond was placed for additional wound integrity and to serve as a barrier. Blood loss for the procedure was 650 cc. The patient received a gram of Ancef prophylactically. The patient was taken to the recovery room in good stable condition with all counts being correct x3.  EBL 650 cc  Shyan Scalisi H 09/25/2015 7:13 PM

## 2015-09-25 NOTE — Anesthesia Procedure Notes (Signed)
Spinal Patient location during procedure: OR Staffing Anesthesiologist: Cecile HearingURK, STEPHEN EDWARD Performed by: anesthesiologist  Preanesthetic Checklist Completed: patient identified, site marked, surgical consent, pre-op evaluation, timeout performed, IV checked, risks and benefits discussed and monitors and equipment checked Spinal Block Patient position: sitting Prep: DuraPrep Patient monitoring: heart rate, cardiac monitor, continuous pulse ox and blood pressure Approach: midline Location: L3-4 Injection technique: single-shot Needle Needle type: Tuohy  Needle gauge: 24 G Needle length: 9 cm Assessment Sensory level: T4

## 2015-09-25 NOTE — Anesthesia Postprocedure Evaluation (Signed)
Anesthesia Post Note  Patient: Whitney FreezeJohanna Petty  Procedure(s) Performed: Procedure(s) (LRB): CESAREAN SECTION (N/A)  Patient location during evaluation: PACU Anesthesia Type: Spinal Level of consciousness: awake and alert and oriented Pain management: pain level controlled Vital Signs Assessment: post-procedure vital signs reviewed and stable Respiratory status: spontaneous breathing, nonlabored ventilation and respiratory function stable Cardiovascular status: blood pressure returned to baseline and stable Postop Assessment: no signs of nausea or vomiting, patient able to bend at knees, spinal receding, no backache and no headache Anesthetic complications: no     Last Vitals:  Filed Vitals:   09/25/15 1930 09/25/15 1945  BP: 106/67 103/70  Pulse:    Temp:  36.9 C  Resp: 18 19    Last Pain:  Filed Vitals:   09/25/15 1951  PainSc: 8    Pain Goal:                 Cloys Vera A.

## 2015-09-26 ENCOUNTER — Encounter (HOSPITAL_COMMUNITY): Payer: Self-pay | Admitting: Obstetrics & Gynecology

## 2015-09-26 LAB — CBC
HCT: 26.5 % — ABNORMAL LOW (ref 36.0–46.0)
HEMOGLOBIN: 8.7 g/dL — AB (ref 12.0–15.0)
MCH: 27.4 pg (ref 26.0–34.0)
MCHC: 32.8 g/dL (ref 30.0–36.0)
MCV: 83.6 fL (ref 78.0–100.0)
Platelets: 189 10*3/uL (ref 150–400)
RBC: 3.17 MIL/uL — ABNORMAL LOW (ref 3.87–5.11)
RDW: 15 % (ref 11.5–15.5)
WBC: 10.6 10*3/uL — ABNORMAL HIGH (ref 4.0–10.5)

## 2015-09-26 MED ORDER — FERROUS SULFATE 325 (65 FE) MG PO TABS
325.0000 mg | ORAL_TABLET | Freq: Two times a day (BID) | ORAL | Status: DC
  Administered 2015-09-26 – 2015-09-27 (×3): 325 mg via ORAL
  Filled 2015-09-26 (×3): qty 1

## 2015-09-26 NOTE — Anesthesia Postprocedure Evaluation (Signed)
Anesthesia Post Note  Patient: Whitney FreezeJohanna Petty  Procedure(s) Performed: Procedure(s) (LRB): CESAREAN SECTION (N/A)  Patient location during evaluation: Mother Baby Anesthesia Type: Spinal Level of consciousness: oriented and awake and alert Pain management: pain level controlled Vital Signs Assessment: post-procedure vital signs reviewed and stable Respiratory status: spontaneous breathing, respiratory function stable and patient connected to nasal cannula oxygen Cardiovascular status: blood pressure returned to baseline and stable Postop Assessment: no headache, no backache and patient able to bend at knees Anesthetic complications: no     Last Vitals:  Filed Vitals:   09/25/15 2300 09/26/15 0825  BP: 100/60 85/53  Pulse: 75 77  Temp: 36.8 C 37 C  Resp: 20 18    Last Pain:  Filed Vitals:   09/26/15 0901  PainSc: 3    Pain Goal: Patients Stated Pain Goal: 3 (09/26/15 0825)               Rica RecordsICKELTON,Omaira Mellen

## 2015-09-26 NOTE — Progress Notes (Signed)
Subjective: Postpartum Day 1: Cesarean Delivery for breech Eating, drinking  well. Still has foley, has stood by bedside once. +flatus.  Lochia and pain wnl.  Denies dizziness, lightheadedness, or sob. No complaints.   Objective: Vital signs in last 24 hours: Temp:  [98.1 F (36.7 C)-99.1 F (37.3 C)] 98.6 F (37 C) (07/08 0825) Pulse Rate:  [75-107] 77 (07/08 0825) Resp:  [16-22] 18 (07/08 0825) BP: (85-106)/(51-71) 85/53 mmHg (07/08 0825) SpO2:  [92 %-98 %] 92 % (07/08 0825)  Physical Exam:  General: alert, cooperative and no distress Lochia: appropriate Uterine Fundus: firm Incision: healing well, no significant drainage, no dehiscence, no significant erythema DVT Evaluation: No evidence of DVT seen on physical exam. Negative Homan's sign. No cords or calf tenderness. No significant calf/ankle edema.   Recent Labs  09/25/15 1612 09/26/15 0541  HGB 10.7* 8.7*  HCT 32.7* 26.5*    Assessment/Plan: Status post Cesarean section. Doing well postoperatively.  Continue current care. D/C foley, begin ambulating.  Breast and bottlefeeding, plans nexplanon for contraception, outpatient circ Hgb 10.7>8.7, added Fe  Marge DuncansBooker, Kimberly Randall 09/26/2015, 12:46 PM

## 2015-09-26 NOTE — Lactation Note (Signed)
This note was copied from a baby's chart. Lactation Consultation Note Interpreter present: c/s mom has very large pendulum breast w/large everted nipple. Hand expression demonstrated w/no colostrum noted. Mom thinks she has no milk since she and I are unable to express any milk. Explained consistency of colostrum and milk coming in 3-5 days. Mom wants to Breast and formula feed. Discussed supply and demand. Encouraged to BF first, then supplement if needed. Gave supplementing formula sheet. Mom encouraged to feed baby 8-12 times/24 hours and with feeding cues. Mom encouraged to waken baby for feeds. Referred to Baby and Me Book in Breastfeeding section Pg. 22-23 for position options and Proper latch demonstration. Educate about newborn behavior, STS, I&O, cluster feeding, supply and demand.  Patient Name: Whitney Cyndy FreezeJohanna [facility] brochure given w/resources, support groups and LC services. Today's Date: 09/26/2015 Reason for consult: Initial assessment   Maternal Data    Feeding Feeding Type: Breast Fed Nipple Type: Slow - flow Length of feed: 5 min  LATCH Score/Interventions Latch: Repeated attempts needed to sustain latch, nipple held in mouth throughout feeding, stimulation needed to elicit sucking reflex.     Type of Nipple: Everted at rest and after stimulation  Comfort (Breast/Nipple): Soft / non-tender           Lactation Tools Discussed/Used     Consult Status Consult Status: Follow-up Date: 09/27/15 Follow-up type: In-patient    Charyl DancerCARVER, Ariq Khamis G 09/26/2015, 1:56 PM

## 2015-09-26 NOTE — Progress Notes (Signed)
LCSW attempted to come by and complete assessment and consult: depression. [name] had a room full of guests and assessment was unable to be completed.  Will re-attempt at a later time. Chart has been reviewed. Will follow up today if time allows, if not prior to DC.  Roxene Alviar LCSW, MSW Clinical Social Work: System Wide Float Coverage for [facility] NICU Clinical social worker [phone] 

## 2015-09-27 NOTE — Progress Notes (Signed)
Subjective: Postpartum Day 2: Cesarean Delivery Patient reports incisional pain, tolerating PO, + flatus and no problems voiding.    Objective: Vital signs in last 24 hours: Temp:  [98 F (36.7 C)-98.3 F (36.8 C)] 98.3 F (36.8 C) (07/09 0656) Pulse Rate:  [70-79] 79 (07/09 0656) Resp:  [16-18] 16 (07/09 0656) BP: (89-94)/(48-59) 90/54 mmHg (07/09 0656) SpO2:  [95 %] 95 % (07/08 1836)  Physical Exam:  General: alert, cooperative, appears stated age and no distress Lochia: appropriate Uterine Fundus: firm Incision: healing well, no significant drainage, no dehiscence, no significant erythema DVT Evaluation: No evidence of DVT seen on physical exam. Negative Homan's sign. No cords or calf tenderness.   Recent Labs  09/25/15 1612 09/26/15 0541  HGB 10.7* 8.7*  HCT 32.7* 26.5*    Assessment/Plan: Status post Cesarean section. Doing well postoperatively.  Continue current care.  Whitney Petty 09/27/2015, 10:14 AM

## 2015-09-27 NOTE — Progress Notes (Signed)
Subjective: Postpartum Day 2: Cesarean Delivery Eating, drinking, voiding, ambulating well.  +flatus.  Lochia and pain wnl.  Denies dizziness, lightheadedness, or sob. No complaints. Declines early d/c.   Objective: Vital signs in last 24 hours: Temp:  [98 F (36.7 C)-98.6 F (37 C)] 98.3 F (36.8 C) (07/09 0656) Pulse Rate:  [70-79] 79 (07/09 0656) Resp:  [16-18] 16 (07/09 0656) BP: (85-94)/(48-59) 90/54 mmHg (07/09 0656) SpO2:  [92 %-95 %] 95 % (07/08 1836)  Physical Exam:  General: alert, cooperative and no distress Lochia: appropriate Uterine Fundus: firm Incision: healing well, no significant drainage, no dehiscence, no significant erythema DVT Evaluation: No evidence of DVT seen on physical exam. Negative Homan's sign. No cords or calf tenderness. No significant calf/ankle edema.   Recent Labs  09/25/15 1612 09/26/15 0541  HGB 10.7* 8.7*  HCT 32.7* 26.5*    Assessment/Plan: Status post Cesarean section. Doing well postoperatively.  Continue fe bid po Continue current care. Plan d/c tomorrow Breast/bottlefeeding Planning nexplanon Outpatient circumcision  Whitney Petty, Whitney Petty Randall 09/27/2015, 7:38 AM

## 2015-09-28 MED ORDER — OXYCODONE-ACETAMINOPHEN 5-325 MG PO TABS: 1.0000 | ORAL_TABLET | ORAL | Status: AC | PRN

## 2015-09-28 MED ORDER — NORETHINDRONE 0.35 MG PO TABS: 1.0000 | ORAL_TABLET | Freq: Every day | ORAL | Status: AC

## 2015-09-28 MED ORDER — IBUPROFEN 600 MG PO TABS: 600.0000 mg | ORAL_TABLET | Freq: Four times a day (QID) | ORAL | Status: AC

## 2015-09-28 MED ORDER — FERROUS SULFATE 325 (65 FE) MG PO TABS: 325.0000 mg | ORAL_TABLET | Freq: Two times a day (BID) | ORAL | Status: AC

## 2015-09-28 NOTE — Progress Notes (Signed)
I assisted Donna,RN with discharge instructions. Kipp LaurenceEda H Royal  Interpreter

## 2015-09-28 NOTE — Discharge Summary (Signed)
OB Discharge Summary  Patient Name: Whitney FreezeJohanna Petty DOB: 1980-04-14 MRN: [id number]  Date of admission: 09/24/2015 Delivering MD: Lazaro ArmsEURE, LUTHER H   Date of discharge: 09/28/2015  Admitting diagnosis: 37WKS, Lyman BishopBLEEDING,LEAKING Intrauterine pregnancy: 4860w5d     Secondary diagnosis:Active Problems:   Unstable lie of fetus   S/P cesarean section  Additional problems:none     Discharge diagnosis: Term Pregnancy Delivered                                                                     Post partum procedures:none  Augmentation: n/a  Complications: None  Hospital course:  Onset of Labor With Unplanned C/S  35 y.o. yo [id number] at 3960w5d was admitted in Active Labor on 09/24/2015. Patient had a labor course significant for malpresentation. Membrane Rupture Time/Date: 8:00 AM ,09/24/2015   The patient went for cesarean section due to Malpresentation, and delivered a Viable infant,09/25/2015  Details of operation can be found in separate operative note. Patient had an uncomplicated postpartum course.  She is ambulating,tolerating a regular diet, passing flatus, and urinating well.  Patient is discharged home in stable condition 09/28/2015.  Physical exam  Filed Vitals:   09/26/15 1836 09/27/15 0656 09/27/15 1708 09/28/15 0602  BP: 89/59 90/54 101/59 100/51  Pulse: 70 79 87 65  Temp: 98.3 F (36.8 C) 98.3 F (36.8 C) 98.4 F (36.9 C)   TempSrc: Oral Oral Oral   Resp: 18 16 16 18   Height:      Weight:      SpO2: 95%  98%    General: alert, cooperative and no distress Lochia: appropriate Uterine Fundus: firm Incision: Healing well with no significant drainage, No significant erythema DVT Evaluation: No evidence of DVT seen on physical exam. Negative Homan's sign. No cords or calf tenderness. Labs: Lab Results  Component Value Date   WBC 10.6* 09/26/2015   HGB 8.7* 09/26/2015   HCT 26.5* 09/26/2015   MCV 83.6 09/26/2015   PLT 189 09/26/2015   CMP Latest Ref Rng 08/24/2015   Glucose 65 - 104 mg/dL 69  BUN 6 - 23 mg/dL -  Creatinine 1.300.50 - 8.651.10 mg/dL -  Sodium 784137 - 696147 mEq/L -  Potassium 3.7 - 5.3 mEq/L -  Chloride 96 - 112 mEq/L -  CO2 19 - 32 mEq/L -  Calcium 8.4 - 10.5 mg/dL -    Discharge instruction: per After Visit Summary and "Baby and Me Booklet".  After Visit Meds:    Medication List    ASK your doctor about these medications        aspirin 81 MG tablet  Take 1 tablet (81 mg total) by mouth daily.     prenatal multivitamin Tabs tablet  Take 1 tablet by mouth daily at 12 noon.     ranitidine 150 MG capsule  Commonly known as:  ZANTAC  Take 1 capsule (150 mg total) by mouth 2 (two) times daily.        Diet: routine diet  Activity: Advance as tolerated. Pelvic rest for 6 weeks.   Outpatient follow up:6 weeks Follow up Appt:No future appointments. Follow up visit: No Follow-up on file.  Postpartum contraception: Progesterone only pills  Newborn Data: Live born female  Birth  Weight: 6 lb 10.5 oz (3020 g) APGAR: 9, 9  Baby Feeding: Breast Disposition:home with mother   09/28/2015 Wyvonnia Dusky, CNM

## 2015-09-28 NOTE — Clinical Social Work Maternal (Signed)
  CLINICAL SOCIAL WORK MATERNAL/CHILD NOTE  Patient Details  Name: Whitney Petty MRN: [id number] Date of Birth: [birth year]  Date:  09/28/2015  Clinical Social Worker Initiating Note:  Laurey Arrow Date/ Time Initiated:  09/28/15/0953     Child's Name:  Whitney Petty   Legal Guardian:  Mother   Need for Interpreter:  Spanish   Date of Referral:  09/27/15     Reason for Referral:  [place], including SI    Referral Source:  [facility]   Address:  [address] [place] [address]  Phone number:  [phone]   Household Members:  Self, Minor Children, Spouse   Natural Supports (not living in the home):  Extended Family, Immediate Family, Spouse/significant other   Professional Supports:  (referral made to [employer])   Employment:     Type of Work:     Education:  9 to 11 years   Museum/gallery curator Resources:  Medicaid   Other Resources:  [facility]   Cultural/Religious Considerations Which May Impact Care:  none reported Strengths:  Ability to meet basic needs , Home prepared for child , Understanding of illness   Risk Factors/Current Problems:  Mental Health Concerns    Cognitive State:  Alert , Insightful    Mood/Affect:  Comfortable , Calm , Relaxed    CSW Assessment: CSW met with [name] and Spanish Speaking Interpreter to complete an assessment for a consult for hx of depression.  [name] was inviting, polite, and engaged with CSW. CSW inquired about [name]'s hx of depression, and [name] stated that she felt fine.  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  [name] acknowledged she did have minor signs and symptoms of PPD, and she was able to communicate what she experienced. [name] stated she was not prescribed any medication, and she did not receive any behavioral health counseling.  CSW educated [name] about PPD. CSW informed [name] of possible supports and interventions to decrease PPD.  CSW also encouraged [name] to seek medical  attention if needed for increased signs and symptoms for PPD.  CSW also offered [name] resources and referrals for behavioral health interventions and [name] declined. However, [name] did accept a referral for the [facility] for parenting education.  [name] reported that overall she is feeling well, and appeared knowledgeable about PDD and where she can go to seek help if needed.  [name] did not have any further questions or concerns at this time.  CSW Plan/Description:  Patient/Family Education , No Further Intervention Required/No Barriers to Discharge, Information/Referral to [employer] [facility] D BOYD-GILYARD, LCSW 09/28/2015, 9:55 AM
